# Patient Record
Sex: Female | Born: 1990 | Race: Black or African American | Hispanic: No | Marital: Single | Smoking: Former smoker
Health system: Southern US, Community
[De-identification: ages and names within clinical notes are randomized; demographics above are authoritative.]

## PROBLEM LIST (undated history)

## (undated) DIAGNOSIS — M791 Myalgia, unspecified site: Secondary | ICD-10-CM

## (undated) HISTORY — PX: MOUTH SURGERY: SHX715

---
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
## 2012-06-27 ENCOUNTER — Encounter: Payer: Self-pay | Admitting: Family Medicine

## 2012-06-27 ENCOUNTER — Ambulatory Visit (INDEPENDENT_AMBULATORY_CARE_PROVIDER_SITE_OTHER): Payer: 59 | Admitting: Family Medicine

## 2012-06-27 ENCOUNTER — Ambulatory Visit: Payer: 59 | Admitting: Family Medicine

## 2012-06-27 ENCOUNTER — Other Ambulatory Visit (HOSPITAL_COMMUNITY)
Admission: RE | Admit: 2012-06-27 | Discharge: 2012-06-27 | Disposition: A | Discharge status: Home / Self Care | Payer: 59 | Source: Ambulatory Visit | Attending: Family Medicine | Admitting: Family Medicine

## 2012-06-27 VITALS — BP 128/82 | HR 77 | Temp 98.6°F | Ht 68.5 in | Wt 326.0 lb

## 2012-06-27 DIAGNOSIS — Z202 Contact with and (suspected) exposure to infections with a predominantly sexual mode of transmission: Secondary | ICD-10-CM | POA: Insufficient documentation

## 2012-06-27 DIAGNOSIS — Z2089 Contact with and (suspected) exposure to other communicable diseases: Secondary | ICD-10-CM

## 2012-06-27 DIAGNOSIS — L83 Acanthosis nigricans: Secondary | ICD-10-CM

## 2012-06-27 DIAGNOSIS — Z113 Encounter for screening for infections with a predominantly sexual mode of transmission: Secondary | ICD-10-CM | POA: Insufficient documentation

## 2012-06-27 DIAGNOSIS — N898 Other specified noninflammatory disorders of vagina: Secondary | ICD-10-CM

## 2012-06-27 DIAGNOSIS — N939 Abnormal uterine and vaginal bleeding, unspecified: Secondary | ICD-10-CM

## 2012-06-27 DIAGNOSIS — N920 Excessive and frequent menstruation with regular cycle: Secondary | ICD-10-CM

## 2012-06-27 LAB — CBC
HCT: 38.7 % (ref 36.0–46.0)
Hemoglobin: 13.1 g/dL (ref 12.0–15.0)
MCH: 27.6 pg (ref 26.0–34.0)
MCHC: 33.9 g/dL (ref 30.0–36.0)

## 2012-06-27 MED ORDER — MEDROXYPROGESTERONE ACETATE 10 MG PO TABS: 10.0000 mg | ORAL_TABLET | Freq: Every day | ORAL | Status: DC

## 2012-06-27 NOTE — Patient Instructions (Signed)
Dear Vanessa Leonard,   It was great to meet you today. Thank you for coming to clinic. Please read below regarding the issues that we discussed.   1. We are going to check some labs for STIs and to check your hgb level. I will call you if abnormal, otherwise you can discuss at your first adult yearly physical. You can also discuss Gardasil at this visit.  2. Please take the provera for 10 days, your bleeding should likely stop within 3 days. If you were to develop chest pain, shortness of breath, lightheadedness, i would want you to be seen immediately.   Please follow up in clinic in within next month for yearly physical . Please call earlier if you have any questions or concerns.   Sincerely,  Dr. Tana Conch  Contraception Choices Birth control (contraception) can stop pregnancy from happening. Different types of birth control work in different ways. Some can:  Make the mucus in the cervix thick. This makes it hard for sperm to get into the uterus.   Thin the lining of the uterus. This makes it hard for an egg to attach to the wall of the uterus.   Stop the ovaries from releasing an egg.   Block the sperm from reaching the egg.  Certain types of surgery can stop pregnancy from happening. For women, the sugery closes the fallopian tubes (tubal ligation). For men, the surgery stops sperm from releasing during sex (vasectomy). HORMONAL BIRTH CONTROL Hormonal birth control stops pregnancy by putting hormones into your body. Types of birth control include:  A small tube put under the skin of the upper arm (implant). The tube can stay in place for 3 years.   Shots given every 3 months.   Pills taken every day or once after sex (intercourse).   Patches that are changed once a week.   A ring put into the vagina (vaginal ring). The ring is left in place for 3 weeks and removed for 1 week. Then, a new ring is put in the vagina.  BARRIER BIRTH CONTROL  Barrier birth control blocks  sperm from reaching the egg. Types of birth control include:   A thin covering worn on the penis (female condom) during sex.   A soft, loose covering put into the vagina (female condom) before sex.   A rubber bowl that sits over the cervix (diaphragm). The bowl must be made for you. The bowl is put into the vagina before sex. The bowl is left in place for 6 to 8 hours after sex.   A small, soft cup that fits over the cervix (cervical cap). The cup must be made for you. The cup can be left in place for 48 hours after sex.   A sponge that is put into the vagina before sex.   A chemical that kills or blocks sperm from getting into the cervix and uterus (spermicide). The chemical may be a cream, jelly, foam, or pill.  INTRAUTERINE (IUD) BIRTH CONTROL  IUD birth control is a small, T-shaped piece of plastic. The plastic is put inside the uterus. There are 2 types of IUD:  Copper IUD. The IUD is covered in copper wire. The copper makes a fluid that kills sperm. It can stay in place for 10 years.   Hormone IUD. The hormone stops pregnancy from happening. It can stay in place for 5 years.  NATURAL FAMILY PLANNING BIRTH CONTROL  Natural family planning means not having sex or using barrier  birth control when the woman is fertile. A woman can:  Use a calendar to keep track of when she is fertile.   Use a thermometer to measure her body temperature.  Protect yourself against sexual diseases no matter what type of birth control you use. Talk to your doctor about which type of birth control is best for you. Document Released: 09/09/2009 Document Revised: 11/01/2011 Document Reviewed: 03/21/2011 [facility] Patient Information 2012 [place], [place].

## 2012-06-28 NOTE — Assessment & Plan Note (Addendum)
Hemodynamically stable without red flags from blood loss. Urine pregnancy negative. CBC with hgb >13 today. Bleeding slowing but persists. Will give 10 day course of Provera.   Patient is to follow up with PCP to discuss birth control options-given handout today.   Patient young for endometrial hyperplasia but could consider endometrial biopsy at future visit.

## 2012-06-28 NOTE — Progress Notes (Signed)
Subjective:   1. Heavy menstrual bleeding-periods regularly with periods on monthly basis lasting 4-5 days. Current heavy period lasting 9 days. She was using 2 maxipads at a time and had to change 5x per day. No whas slowed to 1 pad but still 5 per day. Did have a quarter size clot at beginning of period and has noted a few a dime size or less but those have stopped.   ROS-no chest pain, shortness of breath, lightheadedness, fatigue.   2. Sexual activity-sexually active with 3 female partners and has not told mom. Never has been tested for STDs. Desires STD testing Condoms for birth control. Mom not aware but patient asked me to share with her mother and they plan to discuss after visit.   3. Health Maintenance- interested in Gardasil when outside of current period.   ROS--See HPI  Past Medical History-ADD, morbid obesity .  Reviewed problem list.  Medications- reviewed and updated Chief complaint-noted  Objective:    BP 128/82  Pulse 77  Temp 98.6 F (37 C) (Oral)  Ht 5' 8.5" (1.74 m)  Wt 326 lb (147.873 kg)  BMI 48.85 kg/m2 Gen: NAD CV: RRR no mrg Lungs: CTAB ABd: soft, nontender, nondistended GU: no active bleeding from cervical os. Noted dried blood with some serosanguinous material in vagina but no signs of active bleeding. No abnormalities of uterus on bimanual exam.  MSK: moves all extremities, no edema   Urine pregnancy negative today  Lab Results  Component Value Date   WBC 8.6 06/27/2012   HGB 13.1 06/27/2012   HCT 38.7 06/27/2012   MCV 81.5 06/27/2012   PLT 383 06/27/2012     Assessment/Plan: See problem oriented charted

## 2012-06-28 NOTE — Assessment & Plan Note (Signed)
GC/Chlamydia/syphilis/HIV testing today per patient request.

## 2012-06-30 ENCOUNTER — Encounter: Payer: Self-pay | Admitting: Family Medicine

## 2012-06-30 ENCOUNTER — Ambulatory Visit: Payer: 59 | Admitting: Family Medicine

## 2012-07-03 ENCOUNTER — Encounter: Payer: Self-pay | Admitting: Family Medicine

## 2012-07-03 ENCOUNTER — Ambulatory Visit (INDEPENDENT_AMBULATORY_CARE_PROVIDER_SITE_OTHER): Payer: 59 | Admitting: Family Medicine

## 2012-07-03 VITALS — BP 99/70 | HR 101 | Temp 98.6°F | Ht 68.5 in | Wt 329.0 lb

## 2012-07-03 DIAGNOSIS — Z00129 Encounter for routine child health examination without abnormal findings: Secondary | ICD-10-CM | POA: Insufficient documentation

## 2012-07-03 DIAGNOSIS — Z309 Encounter for contraceptive management, unspecified: Secondary | ICD-10-CM

## 2012-07-03 DIAGNOSIS — Z0000 Encounter for general adult medical examination without abnormal findings: Secondary | ICD-10-CM

## 2012-07-03 DIAGNOSIS — Z23 Encounter for immunization: Secondary | ICD-10-CM

## 2012-07-03 MED ORDER — NORGESTIMATE-ETH ESTRADIOL 0.25-35 MG-MCG PO TABS: 1.0000 | ORAL_TABLET | Freq: Every day | ORAL | Status: DC

## 2012-07-03 NOTE — Progress Notes (Signed)
Patient ID: [place] A Cotugno, female   DOB: 1991-03-28, 21 y.o.   MRN: [id number] Subjective:     History was provided by [place] (self).  [place] SHATIKA GRINNELL is a 21 y.o. female who is here for this wellness visit.   Current Issues: Current concerns include:None  H (Home) Family Relationships: good Communication: good with parents Responsibilities: has responsibilities at home and has a job  E Radiographer, therapeutic): Grades: Graduated and starts [employer] in the fall for [employer] education  School: graduated Future Plans: college  A (Activities) Sports: no sports Exercise: Yes  Activities: job Friends: Yes   A (Auton/Safety) Auto: wears seat belt Bike: doesn't wear bike helmet and does not ride Safety: discussed safe sex practices. She desires BC today and wants to start the pill. Explained STI and condom use. Patient seems to understand.  She is also moving out on her own in a week. Covered home safety. D (Diet) Diet: poor diet habits Risky eating habits: tends to overeat Intake: high fat diet Body Image: positive body image  Drugs Tobacco: No Alcohol: No Drugs: No  Sex Activity: sexually active and risky behaviors  Suicide Risk Emotions: healthy Depression: denies feelings of depression Suicidal: denies suicidal ideation     Objective:     Filed Vitals:   07/03/12 1439  BP: 99/70  Pulse: 101  Temp: 98.6 F (37 C)  TempSrc: Oral  Height: 5' 8.5" (1.74 m)  Weight: 329 lb (149.233 kg)   Growth parameters are noted and are appropriate for age.  General:   alert, cooperative and moderately obese  Gait:   normal  Skin:   normal  Oral cavity:   normal findings: lips normal without lesions  Eyes:   sclerae white, pupils equal and reactive, red reflex normal bilaterally  Ears:   normal bilaterally  Neck:   normal  Lungs:  clear to auscultation bilaterally  Heart:   regular rate and rhythm, S1, S2 normal, no murmur, click, rub or gallop  Abdomen:  soft, non-tender; bowel  sounds normal; no masses,  no organomegaly  GU:  not examined; Patient had a exam within the last month  Extremities:   extremities normal, atraumatic, no cyanosis or edema and no edema, redness or tenderness in the calves or thighs  Neuro:  normal without focal findings, mental status, speech normal, alert and oriented x3, PERLA, cranial nerves 2-12 intact and reflexes normal and symmetric     Assessment:    Healthy 21 y.o. female child.    Plan:   1. Anticipatory guidance discussed. Nutrition, Physical activity, Behavior, Safety and Handout given  2. Follow-up visit in 12 months for next wellness visit, or sooner as needed.

## 2012-07-03 NOTE — Assessment & Plan Note (Addendum)
[facility] 21 y.o healthy female - Immunizations : Menactra, Varicella, HPV #3 - AVS  STI and safety - Desiring contraception:         - Urine PT ordered today, will call if positive only        - [facility] called in to pharamacy         - Discussed safety in the home, car, going out (drinking), and safe sex - F/U: 1 year or PRN if any problems

## 2012-07-03 NOTE — Patient Instructions (Signed)
Safer Sex Your caregiver wants you to have this information about the infections that can be transmitted from sexual contact and how to prevent them. The idea behind safer sex is that you can be sexually active, and at the same time reduce the risk of giving or getting a sexually transmitted disease (STD). Every person should be aware of how to prevent him or herself and his or her sex partner from getting an STD. CAUSES OF STDS STDs are transmitted by sharing body fluids, which contain viruses and bacteria. The following fluids all transmit infections during sexual intercourse and sex acts:  Semen.   Saliva.   Urine.   Blood.   Vaginal mucus.  Examples of STDs include:  Chlamydia.   Gonorrhea.   Genital herpes.   Hepatitis B.   Human immunodeficiency virus or acquired immunodeficiency syndrome (HIV or AIDS).   Syphilis.   Trichomonas.   Pubic lice.   Human papillomavirus (HPV), which may include:   Genital warts.   Cervical dysplasia.   Cervical cancer (can develop with certain types of HPV).  SYMPTOMS  Sexual diseases often cause few or no symptoms until they are advanced, so a person can be infected and spread the infection without knowing it. Some STDs respond to treatment very well. Others, like HIV and herpes, cannot be cured, but are treated to reduce their effects. Specific symptoms include:  Abnormal vaginal discharge.   Irritation or itching in and around the vagina, and in the pubic hair.   Pain during sexual intercourse.   Bleeding during sexual intercourse.   Pelvic or abdominal pain.   Fever.   Growths in and around the vagina.   An ulcer in or around the vagina.   Swollen glands in the groin area.  DIAGNOSIS   Blood tests.   Pap test.   Culture test of abnormal vaginal discharge.   A test that applies a solution and examines the cervix with a lighted magnifying scope (colposcopy).   A test that examines the pelvis with a lighted  tube, through a small incision (laparoscopy).  TREATMENT  The treatment will depend on the cause of the STD.  Antibiotic treatment by injection, oral, creams, or suppositories in the vagina.   Over-the-counter medicated shampoo, to get rid of pubic lice.   Removing or treating growths with medicine, freezing, burning (electrocautery), or surgery.   Surgery treatment for HPV of the cervix.   Supportive medicines for herpes, HIV, AIDS, and hepatitis.  Being careful cannot eliminate all risk of infection, but sex can be made much safer. Safe sexual practices include body massage and gentle touching. Masturbation is safe, as long as body fluids do not contact skin that has sores or cuts. Dry kissing and oral sex on a man wearing a latex condom or on a woman wearing a female condom is also safe. Slightly less safe is intercourse while the man wears a latex condom or wet kissing. It is also safer to have one sex partner that you know is not having sex with anyone else. LENGTH OF ILLNESS An STD might be treated and cured in a week, sometimes a month, or more. And it can linger with symptoms for many years. STDs can also cause damage to the female organs. This can cause chronic pain, infertility, and recurrence of the STD, especially herpes, hepatitis, HIV, and HPV. HOME CARE INSTRUCTIONS AND PREVENTION  Alcohol and recreational drugs are often the reason given for not practicing safer sex. These substances affect   your judgment. Alcohol and recreational drugs can also impair your immune system, making you more vulnerable to disease.   Do not engage in risky and dangerous sexual practices, including:   Vaginal or anal sex without a condom.   Oral sex on a man without a condom.   Oral sex on a woman without a female condom.   Using saliva to lubricate a condom.   Any other sexual contact in which body fluids or blood from one partner contact the other partner.   You should use only latex  condoms for men and water soluble lubricants. Petroleum based lubricants or oils used to lubricate a condom will weaken the condom and increase the chance that it will break.   Think very carefully before having sex with anyone who is high risk for STDs and HIV. This includes IV drug users, people with multiple sexual partners, or people who have had an STD, or a positive hepatitis or HIV blood test.   Remember that even if your partner has had only one previous partner, their previous partner might have had multiple partners. If so, you are at high risk of being exposed to an STD. You and your sex partner should be the only sex partners with each other, with no one else involved.   A vaccine is available for hepatitis B and HPV through your caregiver or the Public Health Department. Everyone should be vaccinated with these vaccines.   Avoid risky sex practices. Sex acts that can break the skin make you more likely to get an STD.  SEEK MEDICAL CARE IF:   If you think you have an STD, even if you do not have any symptoms. Contact your caregiver for evaluation and treatment, if needed.   You think or know your sex partner has acquired an STD.   You have any of the symptoms mentioned above.  Document Released: 12/20/2004 Document Revised: 11/01/2011 Document Reviewed: 10/12/2009 [employer] Patient Information 2012 [employer]. 

## 2013-04-08 ENCOUNTER — Encounter (HOSPITAL_COMMUNITY): Payer: Self-pay | Admitting: Emergency Medicine

## 2013-04-08 ENCOUNTER — Emergency Department (HOSPITAL_COMMUNITY)
Admission: EM | Admit: 2013-04-08 | Discharge: 2013-04-08 | Disposition: A | Discharge status: Home / Self Care | Payer: 59 | Source: Home / Self Care | Attending: Family Medicine | Admitting: Family Medicine

## 2013-04-08 DIAGNOSIS — S91209A Unspecified open wound of unspecified toe(s) with damage to nail, initial encounter: Secondary | ICD-10-CM

## 2013-04-08 DIAGNOSIS — S91109A Unspecified open wound of unspecified toe(s) without damage to nail, initial encounter: Secondary | ICD-10-CM

## 2013-04-08 NOTE — ED Notes (Signed)
Patient reports pcp is family practice.  Reports tetanus was 2 years a

## 2013-04-08 NOTE — ED Provider Notes (Signed)
History     CSN: [id number]  Arrival date & time 04/08/13  1413   First MD Initiated Contact with Patient 04/08/13 1603      Chief Complaint  Patient presents with  . Fall    (Consider location/radiation/quality/duration/timing/severity/associated sxs/prior treatment) Patient is a 22 y.o. female presenting with foot injury. The history is provided by the patient.  Foot Injury Location:  Toe Time since incident:  1 day Injury: yes   Toe location:  L big toe Pain details:    Quality:  Burning   Radiates to:  Does not radiate   Onset quality:  Sudden (slipped and jammed left gt toenail in [employer] store)   Progression:  Unchanged Chronicity:  New Dislocation: no   Tetanus status:  Up to date Associated symptoms: neck pain   Associated symptoms: no back pain   Associated symptoms comment:  Abrasion to left lower leg and neck soreness today. Risk factors: obesity     History reviewed. No pertinent past medical history.  History reviewed. No pertinent past surgical history.  No family history on file.  History  Substance Use Topics  . Smoking status: Never Smoker   . Smokeless tobacco: Not on file  . Alcohol Use: No    OB History   Grav Para Term Preterm Abortions TAB SAB Ect Mult Living                  Review of Systems  Constitutional: Negative.   HENT: Positive for neck pain.   Cardiovascular: Negative for chest pain.  Gastrointestinal: Negative.   Musculoskeletal: Negative for back pain, joint swelling and gait problem.  Skin: Positive for wound.    Allergies  Review of patient's allergies indicates no known allergies.  Home Medications   Current Outpatient Rx  Name  Route  Sig  Dispense  Refill  . norgestimate-ethinyl estradiol (ORTHO-CYCLEN,SPRINTEC,PREVIFEM) 0.25-35 MG-MCG tablet   Oral   Take 1 tablet by mouth daily.   1 Package   11     BP 132/70  Pulse 74  Temp(Src) 98.6 F (37 C) (Oral)  Resp 16  SpO2 100%  LMP  03/09/2013  Physical Exam  Nursing note and vitals reviewed. Constitutional: She is oriented to person, place, and time. She appears well-developed and well-nourished.  HENT:  Head: Normocephalic.  Neck: Normal range of motion. Neck supple.  Musculoskeletal:       Feet:  Neurological: She is alert and oriented to person, place, and time.  Skin: Skin is warm and dry.  Minor lower leg linear abrasion    ED Course  Procedures (including critical care time)  Labs Reviewed - No data to display No results found.   1. Avulsion of toenail, initial encounter       MDM          Linna Hoff, MD 04/10/13 1006

## 2013-04-08 NOTE — ED Notes (Signed)
Reports a fall yesterday, left great toe nail is loosely attached and minimal bleeding around nail, bleeding controlled.  Abrasion to left lower leg. This nurse with physician when assessed

## 2013-04-08 NOTE — ED Notes (Signed)
Vitals obtained by [employer] student sam

## 2013-04-15 ENCOUNTER — Encounter: Payer: Self-pay | Admitting: Family Medicine

## 2013-04-15 ENCOUNTER — Ambulatory Visit (INDEPENDENT_AMBULATORY_CARE_PROVIDER_SITE_OTHER): Payer: 59 | Admitting: Family Medicine

## 2013-04-15 VITALS — BP 149/85 | HR 102 | Ht 68.0 in | Wt 329.0 lb

## 2013-04-15 DIAGNOSIS — M549 Dorsalgia, unspecified: Secondary | ICD-10-CM

## 2013-04-15 DIAGNOSIS — M62830 Muscle spasm of back: Secondary | ICD-10-CM | POA: Insufficient documentation

## 2013-04-15 DIAGNOSIS — R030 Elevated blood-pressure reading, without diagnosis of hypertension: Secondary | ICD-10-CM

## 2013-04-15 MED ORDER — CYCLOBENZAPRINE HCL 10 MG PO TABS: 5.0000 mg | ORAL_TABLET | Freq: Two times a day (BID) | ORAL | Status: DC | PRN

## 2013-04-15 NOTE — Patient Instructions (Addendum)
Seems like you have some muscle spasm after your fall. No sign of any serious damage today. Normal to have a recovery period of several weeks. You can try flexeril at night for spasm. Keep using motrin/ibuprofen at 800 mg up to 2-3 times daily.  Try back stretches and exercises.  Make an appointmetn with Dr. Claiborne Billings in 3 weeks or sooner if worsens.   Back Exercises Back exercises help treat and prevent back injuries. The goal is to increase your strength in your belly (abdominal) and back muscles. These exercises can also help with flexibility. Start these exercises when told by your doctor. HOME CARE Back exercises include: Pelvic Tilt.  Lie on your back with your knees bent. Tilt your pelvis until the lower part of your back is against the floor. Hold this position 5 to 10 sec. Repeat this exercise 5 to 10 times. Knee to Chest.  Pull 1 knee up against your chest and hold for 20 to 30 seconds. Repeat this with the other knee. This may be done with the other leg straight or bent, whichever feels better. Then, pull both knees up against your chest. Sit-Ups or Curl-Ups.  Bend your knees 90 degrees. Start with tilting your pelvis, and do a partial, slow sit-up. Only lift your upper half 30 to 45 degrees off the floor. Take at least 2 to 3 seonds for each sit-up. Do not do sit-ups with your knees out straight. If partial sit-ups are difficult, simply do the above but with only tightening your belly (abdominal) muscles and holding it as told. Hip-Lift.  Lie on your back with your knees flexed 90 degrees. Push down with your feet and shoulders as you raise your hips 2 inches off the floor. Hold for 10 seconds, repeat 5 to 10 times. Back Arches.  Lie on your stomach. Prop yourself up on bent elbows. Slowly press on your hands, causing an arch in your low back. Repeat 3 to 5 times. Shoulder-Lifts.  Lie face down with arms beside your body. Keep hips and belly pressed to floor as you slowly lift  your head and shoulders off the floor. Do not overdo your exercises. Be careful in the beginning. Exercises may cause you some mild back discomfort. If the pain lasts for more than 15 minutes, stop the exercises until you see your doctor. Improvement with exercise for back problems is slow.  Document Released: 12/15/2010 Document Revised: 02/04/2012 Document Reviewed: 09/13/2011 [facility] Patient Information 2014 [place], [place].

## 2013-04-15 NOTE — Progress Notes (Signed)
  Subjective:    Patient ID: Vanessa Leonard, female    DOB: July 25, 1991, 22 y.o.   MRN: [id number]  HPI  22 yo female presents with neck and back pain after a fall on 5/14 at a store on a slick floor. Patient with her mother today, state they just want to make sure nothing serious and needs something for pain.   Fell straight onto her back one week ago. Neck pain started the next day on the left side, worse with turning head to right and low back feels stiff also. Neck is much better since the injury, the low back is slightly improved since injury. Still using ibuprofen twice a day currently.   Review of Systems Denies any numbness, tingling, weakness, joint pain, headaches, recurrent falls, vision changes, pain in hands/feet, urinary changes.     Objective:   Physical Exam  Vitals reviewed. Constitutional: She appears well-developed and well-nourished. No distress.  Obese AA female.  Eyes: EOM are normal. Pupils are equal, round, and reactive to light.  Neck: Normal range of motion. Neck supple.  ROM intact. Mild pain with bilateral lat flexion. Rotation intact. TTP on left trapezius. 5/5 strength with grip, pincer, shoulder abduction bilaterally.   Musculoskeletal:  TTP on left paraspinal musculature surrounding T10-L5. No bony TTP. Lumbar flexion slightly decreased. Normal gait. 5/5 dorsiflexion bilaterally, knee flexion/extension. Negative straight leg test.  Normal gait.  Sensation intact in all extremities.   Skin: No rash noted. She is not diaphoretic.  Psychiatric: She has a normal mood and affect.          Assessment & Plan:

## 2013-04-15 NOTE — Assessment & Plan Note (Signed)
Noted today in acute pain. Could be related to NSAID or OCP use. Advised to f/u with PCP in 3 weeks.

## 2013-04-15 NOTE — Assessment & Plan Note (Signed)
Some back and neck MSK pain and spasm. Has improved since her fall one week ago. Her overall ROM is intact without neurologic red flags or radiculopathy today. C/w diffuse left trapezius and paraspinal musculature spasm. Recommend to continue NSAID. Start flexeril qhs prn. Discussed stretches, heating pain/ice pack, strengthening exercises. F/u with PCP in 3 weeks or sooner if worsens. Could consider PT or imaging if not improving.

## 2013-07-15 ENCOUNTER — Other Ambulatory Visit (HOSPITAL_COMMUNITY)
Admission: RE | Admit: 2013-07-15 | Discharge: 2013-07-15 | Disposition: A | Discharge status: Home / Self Care | Payer: 59 | Source: Ambulatory Visit | Attending: Family Medicine | Admitting: Family Medicine

## 2013-07-15 ENCOUNTER — Encounter: Payer: Self-pay | Admitting: Family Medicine

## 2013-07-15 ENCOUNTER — Ambulatory Visit (INDEPENDENT_AMBULATORY_CARE_PROVIDER_SITE_OTHER): Payer: 59 | Admitting: Family Medicine

## 2013-07-15 VITALS — BP 144/76 | HR 83 | Temp 99.0°F | Ht 68.0 in | Wt 327.1 lb

## 2013-07-15 DIAGNOSIS — A499 Bacterial infection, unspecified: Secondary | ICD-10-CM

## 2013-07-15 DIAGNOSIS — B9689 Other specified bacterial agents as the cause of diseases classified elsewhere: Secondary | ICD-10-CM

## 2013-07-15 DIAGNOSIS — N760 Acute vaginitis: Secondary | ICD-10-CM

## 2013-07-15 DIAGNOSIS — Z202 Contact with and (suspected) exposure to infections with a predominantly sexual mode of transmission: Secondary | ICD-10-CM

## 2013-07-15 DIAGNOSIS — Z2089 Contact with and (suspected) exposure to other communicable diseases: Secondary | ICD-10-CM

## 2013-07-15 DIAGNOSIS — Z113 Encounter for screening for infections with a predominantly sexual mode of transmission: Secondary | ICD-10-CM | POA: Insufficient documentation

## 2013-07-15 DIAGNOSIS — Z309 Encounter for contraceptive management, unspecified: Secondary | ICD-10-CM

## 2013-07-15 DIAGNOSIS — Z3009 Encounter for other general counseling and advice on contraception: Secondary | ICD-10-CM | POA: Insufficient documentation

## 2013-07-15 LAB — POCT WET PREP (WET MOUNT)
Clue Cells Wet Prep Whiff POC: POSITIVE
WBC, Wet Prep HPF POC: >20

## 2013-07-15 MED ORDER — METRONIDAZOLE 500 MG PO TABS: 500.0000 mg | ORAL_TABLET | Freq: Two times a day (BID) | ORAL | Status: DC

## 2013-07-15 MED ORDER — MEDROXYPROGESTERONE ACETATE 150 MG/ML IM SUSP
150.0000 mg | Freq: Once | INTRAMUSCULAR | Status: AC
  Administered 2013-07-15: 150 mg via INTRAMUSCULAR

## 2013-07-15 NOTE — Patient Instructions (Addendum)
Safe Sex Safe sex is about reducing the risk of giving or getting a sexually transmitted disease (STD). STDs are spread through sexual contact involving the genitals, mouth, or rectum. Some STDS can be cured and others cannot. Safe sex can also prevent unintended pregnancies.  SAFE SEX PRACTICES  Limit your sexual activity to only one partner who is only having sex with you.  Talk to your partner about their past partners, past STDs, and drug use.  Use a condom every time you have sexual intercourse. This includes vaginal, oral, and anal sexual activity. Both females and males should wear condoms during oral sex. Only use latex or polyurethane condoms and water-based lubricants. Petroleum-based lubricants or oils used to lubricate a condom will weaken the condom and increase the chance that it will break. The condom should be in place from the beginning to the end of sexual activity. Wearing a condom reduces, but does not completely eliminate, your risk of getting or giving a STD. STDs can be spread by contact with skin of surrounding areas.  Get vaccinated for hepatitis B and HPV.  Avoid alcohol and recreational drugs which can affect your judgement. You may forget to use a condom or participate in high-risk sex.  For females, avoid douching after sexual intercourse. Douching can spread an infection farther into the reproductive tract.  Check your body for signs of sores, blisters, rashes, or unusual discharge. See your caregiver if you notice any of these signs.  Avoid sexual contact if you have symptoms of an infection or are being treated for an STD. If you or your partner has herpes, avoid sexual contact when blisters are present. Use condoms at all other times.  See your caregiver for regular screenings, examinations, and tests for STDs. Before having sex with a new partner, each of you should be screened for STDs and talk about the results with your partner. BENEFITS OF SAFE SEX   There  is less of a chance of getting or giving an STD.  You can prevent unwanted or unintended pregnancies.  By discussing safer sex concerns with your partner, you may increase feelings of intimacy, comfort, trust, and honesty between the both of you. Document Released: 12/20/2004 Document Revised: 08/06/2012 Document Reviewed: 05/05/2012 [facility] Patient Information 2014 [place], [place].   Your next Depo shot will be in 3 months. We will evaluate at that time if you are happy with it and want to remain on it or try another pill.  I will call you with all your test results today.

## 2013-07-15 NOTE — Assessment & Plan Note (Signed)
--   wet prep today with clue cells and positive whiff.  - Flagyl 7 days prescribed.

## 2013-07-15 NOTE — Progress Notes (Signed)
Patient is due for next depo 11/5 - 11/19

## 2013-07-15 NOTE — Assessment & Plan Note (Signed)
-   Patient again feels she may have been exposed to STD. STD panel repeated today.  - AVS on safe sex practices given and discussed

## 2013-07-15 NOTE — Progress Notes (Signed)
Subjective:     Patient ID: Vanessa Leonard, female   DOB: June 10, 1991, 22 y.o.   MRN: [id number]  HPI  Contraception management: Patient has not taken contraception (BCP) since June because she does not like "the way it made her feel." She states she felt sick on it. Her last LMP was June 10th. She has had unprotected sex in the meantime (weeks ago). She would like to try the depo shot today in place of BCP. She is sexually active with one partner.   Vaginal discharge: She reports her vaginal area was swollen for a couple days. She reports white discharge with mild irritation. She does not endorse lesions prior. She has concerns for STD and would like to be tested.    Patient's past medical, social, and family history were reviewed and updated as appropriate.  Review of Systems Negative, with the exception of above mentioned in HPI  Objective:   Physical Exam BP 144/76  Pulse 83  Temp(Src) 99 F (37.2 C) (Oral)  Ht 5\' 8"  (1.727 m)  Wt 327 lb 1.6 oz (148.372 kg)  BMI 49.75 kg/m2 Gen: morbidly obese female. Pleasant. Immature.  CV: RRR Chest: CTAB Abd: Obese. Soft. NTND. BS present GYN:  External genitalia within normal limits.  Vaginal mucosa pink, moist, normal rugae.  Nonfriable cervix without lesions, mild white thin discharge. No bleeding noted on speculum exam.  Bimanual exam revealed normal, nongravid uterus.  No cervical motion tenderness. No adnexal masses bilaterally.    Next Visit: - check BP

## 2013-07-15 NOTE — Assessment & Plan Note (Signed)
-   Patient does nto desire the BCP any longer. She feels it makes her sick.  - Upreg negative today - Depo shot given - return in 3 months to discuss if she would like to continue with depo or try another pill

## 2013-07-17 ENCOUNTER — Telehealth: Payer: Self-pay | Admitting: Family Medicine

## 2013-07-17 NOTE — Telephone Encounter (Signed)
PLease call Vanessa Leonard and inform her the rest of her labs are back and are normal. (No STD). Thanks

## 2013-07-17 NOTE — Telephone Encounter (Signed)
LM with patient's mother for her to call us back 

## 2013-07-17 NOTE — Telephone Encounter (Signed)
Patient called back and was given the message below

## 2013-07-23 ENCOUNTER — Encounter (HOSPITAL_COMMUNITY): Payer: Self-pay

## 2013-07-23 ENCOUNTER — Emergency Department (HOSPITAL_COMMUNITY)
Admission: EM | Admit: 2013-07-23 | Discharge: 2013-07-23 | Discharge status: Against Medical Advice | Payer: 59 | Attending: Emergency Medicine | Admitting: Emergency Medicine

## 2013-07-23 DIAGNOSIS — M549 Dorsalgia, unspecified: Secondary | ICD-10-CM | POA: Insufficient documentation

## 2013-07-23 DIAGNOSIS — R109 Unspecified abdominal pain: Secondary | ICD-10-CM | POA: Insufficient documentation

## 2013-07-23 HISTORY — DX: Myalgia, unspecified site: M79.10

## 2013-07-23 NOTE — ED Notes (Signed)
Pt states approach 1 hour ago she began having lower back pain and is on flexeril and hydrocodone for pain. Told ems it is more her back pain,

## 2013-07-23 NOTE — ED Notes (Signed)
Pt called in lobby, no answer.

## 2013-09-29 ENCOUNTER — Encounter: Payer: Self-pay | Admitting: Emergency Medicine

## 2013-09-29 ENCOUNTER — Ambulatory Visit (INDEPENDENT_AMBULATORY_CARE_PROVIDER_SITE_OTHER): Payer: 59 | Admitting: Emergency Medicine

## 2013-09-29 VITALS — BP 117/78 | HR 52 | Temp 97.7°F | Ht 68.0 in | Wt 315.0 lb

## 2013-09-29 DIAGNOSIS — M5380 Other specified dorsopathies, site unspecified: Secondary | ICD-10-CM

## 2013-09-29 DIAGNOSIS — R109 Unspecified abdominal pain: Secondary | ICD-10-CM | POA: Insufficient documentation

## 2013-09-29 DIAGNOSIS — M62830 Muscle spasm of back: Secondary | ICD-10-CM

## 2013-09-29 MED ORDER — CYCLOBENZAPRINE HCL 10 MG PO TABS: 5.0000 mg | ORAL_TABLET | Freq: Two times a day (BID) | ORAL | Status: DC | PRN

## 2013-09-29 NOTE — Patient Instructions (Signed)
It was nice to meet you!  I think your stomach pains and some of your back pains are related to starting Depo shots.  Give it another 3 months to see how your body will adjust to the depo shots. You can take ibuprofen as needed for the pain. I also sent in some flexeril that you can use for your back as needed.  If the pain worsens, becomes more constant, or changes, please let us know.

## 2013-09-29 NOTE — Assessment & Plan Note (Addendum)
Given timing, likely related to Depo shot and irregular bleeding. No red flags on history or exam. Discussed symptomatic care with ibuprofen. Will give Depo another 3 months before deciding on stopping. Return precautions reviewed as in AVS.

## 2013-09-29 NOTE — Progress Notes (Signed)
  Subjective:    Patient ID: Vanessa Leonard, female    DOB: 08/16/1991, 22 y.o.   MRN: [id number]  HPI Vanessa Leonard is here for a SDA for stomach pains.  She reports that since August she has been having intermittent stomach pains and back pains.  She states the stomach pain is always across the middle, but the back pain will move around.  It occurs 1-2 times a month and lasts for a few hours.  She has tried aleve and it helps with the pain.  She started Depo in August and has had some irregular bleeding since then.  She is not sure if the pains are associated with the bleeding.  No nausea, vomiting, diarrhea, dysuria, fevers.  Denies any abdominal pain today.  Does have some right upper back pain today.  I have reviewed and updated the following as appropriate: allergies and current medications SHx: former smoker  Review of Systems See HPI    Objective:   Physical Exam BP 117/78  Pulse 52  Temp(Src) 97.7 F (36.5 C) (Oral)  Ht 5\' 8"  (1.727 m)  Wt 315 lb (142.883 kg)  BMI 47.91 kg/m2  LMP 09/25/2013 Gen: alert, cooperative, NAD HEENT: AT/[place], sclera white, MMM Neck: supple Back: tender over right trapezius with mild spasm Abd: +BS, soft, NTND     Assessment & Plan:

## 2013-09-29 NOTE — Assessment & Plan Note (Signed)
Having intermittent continues spasms. Will refill flexeril.

## 2013-10-09 ENCOUNTER — Encounter: Payer: Self-pay | Admitting: Family Medicine

## 2013-10-09 ENCOUNTER — Ambulatory Visit (INDEPENDENT_AMBULATORY_CARE_PROVIDER_SITE_OTHER): Payer: 59 | Admitting: Family Medicine

## 2013-10-09 VITALS — BP 140/80 | HR 106 | Temp 98.2°F | Ht 68.0 in | Wt 314.4 lb

## 2013-10-09 DIAGNOSIS — L0231 Cutaneous abscess of buttock: Secondary | ICD-10-CM

## 2013-10-09 MED ORDER — AMOXICILLIN-POT CLAVULANATE 875-125 MG PO TABS: 1.0000 | ORAL_TABLET | Freq: Two times a day (BID) | ORAL | Status: DC

## 2013-10-09 MED ORDER — HYDROCODONE-ACETAMINOPHEN 5-325 MG PO TABS: 1.0000 | ORAL_TABLET | Freq: Four times a day (QID) | ORAL | Status: DC | PRN

## 2013-10-09 NOTE — Assessment & Plan Note (Signed)
Suspect skin origin.  Draining spontaneously.  Given foul smell, will treat for anaerobes rather than MRSA.

## 2013-10-09 NOTE — Patient Instructions (Signed)
Take the antibiotic for 10 days until gone even if you are feeling better. I will give you a prescription for a few pain pills It is best if the infection keeps draining.  Take a hot bath twice a day and massage the area to keep it open. I do not need to see you again if it clears up.  If a pus pocket forms again, I will need to see you to drain it.

## 2013-10-09 NOTE — Assessment & Plan Note (Signed)
Check for DM negative - nl A1C. Still advise wt loss

## 2013-10-09 NOTE — Progress Notes (Signed)
  Subjective:    Patient ID: Vanessa Leonard, female    DOB: 08-06-1991, 22 y.o.   MRN: [id number]  HPI Patient has painful infection on upper inner aspect of thigh.  Drained spontaneously this morning with foul smelling pus.  No systemic symptoms.  No history of frequent abscesses.  Has had acanthosis nigricans and is morbidly obese, so DM is possible as risk factor    Review of Systems     Objective:   Physical ExamObesity hampers exam.  Abscess which I think originated from skin although it could be perirectal since proximate to anus.  A couple of cm away from vagina so doubt vaginal origin.  Draining anaerobic smelling discharge.  Quite a bit of surrounding tenderness.  Not much erythema or cellulits.  No fluctuant area to drain.        Assessment & Plan:

## 2013-10-16 ENCOUNTER — Encounter (HOSPITAL_COMMUNITY): Payer: Self-pay | Admitting: Emergency Medicine

## 2013-10-16 ENCOUNTER — Emergency Department (HOSPITAL_COMMUNITY)
Admission: EM | Admit: 2013-10-16 | Discharge: 2013-10-16 | Disposition: A | Discharge status: Home / Self Care | Payer: 59 | Attending: Emergency Medicine | Admitting: Emergency Medicine

## 2013-10-16 DIAGNOSIS — J069 Acute upper respiratory infection, unspecified: Secondary | ICD-10-CM | POA: Insufficient documentation

## 2013-10-16 DIAGNOSIS — Z3202 Encounter for pregnancy test, result negative: Secondary | ICD-10-CM | POA: Insufficient documentation

## 2013-10-16 DIAGNOSIS — Z8709 Personal history of other diseases of the respiratory system: Secondary | ICD-10-CM | POA: Insufficient documentation

## 2013-10-16 DIAGNOSIS — Z87891 Personal history of nicotine dependence: Secondary | ICD-10-CM | POA: Insufficient documentation

## 2013-10-16 DIAGNOSIS — R1110 Vomiting, unspecified: Secondary | ICD-10-CM | POA: Insufficient documentation

## 2013-10-16 DIAGNOSIS — R0789 Other chest pain: Secondary | ICD-10-CM | POA: Insufficient documentation

## 2013-10-16 LAB — URINALYSIS, ROUTINE W REFLEX MICROSCOPIC
Glucose, UA: NEGATIVE mg/dL
Nitrite: NEGATIVE
Specific Gravity, Urine: 1.03 (ref 1.005–1.030)
pH: 5.5 (ref 5.0–8.0)

## 2013-10-16 LAB — URINE MICROSCOPIC-ADD ON

## 2013-10-16 LAB — PREGNANCY, URINE: Preg Test, Ur: NEGATIVE

## 2013-10-16 MED ORDER — HYDROCODONE-HOMATROPINE 5-1.5 MG/5ML PO SYRP
5.0000 mL | ORAL_SOLUTION | Freq: Once | ORAL | Status: AC
  Administered 2013-10-16: 5 mL via ORAL
  Filled 2013-10-16: qty 5

## 2013-10-16 MED ORDER — HYDROCODONE-HOMATROPINE 5-1.5 MG/5ML PO SYRP: 5.0000 mL | ORAL_SOLUTION | Freq: Four times a day (QID) | ORAL | Status: DC | PRN

## 2013-10-16 MED ORDER — CETIRIZINE-PSEUDOEPHEDRINE ER 5-120 MG PO TB12: 1.0000 | ORAL_TABLET | Freq: Two times a day (BID) | ORAL | Status: DC

## 2013-10-16 NOTE — ED Provider Notes (Signed)
CSN: [id number]     Arrival date & time 10/16/13  0208 History   First MD Initiated Contact with Patient 10/16/13 0344     Chief Complaint  Patient presents with  . Cough   (Consider location/radiation/quality/duration/timing/severity/associated sxs/prior Treatment) HPI Comments: Patient is a 22 year old otherwise healthy female who presents for a cough with onset this morning. Patient states that cough is productive of clear sputum and without any modifying factors. Patient endorses associated posttussive emesis as well as nasal congestion and postnasal drip. Patient has not taken anything for her symptoms today and she denies sick contacts. Patient states she has a history of seasonal allergies, but she denies history of asthma. She further denies associated fever, sore throat, inability to swallow or drooling, neck pain or stiffness, chest pain, shortness of breath, syncope, or numbness or tingling.  Patient is a 22 y.o. female presenting with cough. The history is provided by the patient. No language interpreter was used.  Cough Associated symptoms: no chest pain and no fever     Past Medical History  Diagnosis Date  . Muscle ache    Past Surgical History  Procedure Laterality Date  . Mouth surgery     History reviewed. No pertinent family history. History  Substance Use Topics  . Smoking status: Former Games developer  . Smokeless tobacco: Never Used  . Alcohol Use: No   OB History   Grav Para Term Preterm Abortions TAB SAB Ect Mult Living                 Review of Systems  Constitutional: Negative for fever.  Respiratory: Positive for cough and chest tightness.   Cardiovascular: Negative for chest pain.  Gastrointestinal: Positive for vomiting (posttussive). Negative for nausea.  All other systems reviewed and are negative.    Allergies  Review of patient's allergies indicates no known allergies.  Home Medications   Current Outpatient Rx  Name  Route  Sig  Dispense   Refill  . amoxicillin-clavulanate (AUGMENTIN) 875-125 MG per tablet   Oral   Take 1 tablet by mouth 2 (two) times daily.   20 tablet   0   . cyclobenzaprine (FLEXERIL) 10 MG tablet   Oral   Take 0.5-1 tablets (5-10 mg total) by mouth 2 (two) times daily as needed for muscle spasms.   30 tablet   0   . HYDROcodone-acetaminophen (NORCO/VICODIN) 5-325 MG per tablet   Oral   Take 1 tablet by mouth every 6 (six) hours as needed for moderate pain.   12 tablet   0   . medroxyPROGESTERone (DEPO-PROVERA) 150 MG/ML injection   Intramuscular   Inject 150 mg into the muscle every 3 (three) months.          BP 101/61  Pulse 115  Temp(Src) 98.6 F (37 C) (Oral)  Resp 20  Ht 5\' 8"  (1.727 m)  Wt 314 lb (142.429 kg)  BMI 47.75 kg/m2  SpO2 98%  LMP 09/25/2013  Physical Exam  Nursing note and vitals reviewed. Constitutional: She is oriented to person, place, and time. She appears well-developed and well-nourished. No distress.  HENT:  Head: Normocephalic and atraumatic.  Right Ear: Tympanic membrane, external ear and ear canal normal.  Left Ear: Tympanic membrane, external ear and ear canal normal.  Nose: Right sinus exhibits no maxillary sinus tenderness and no frontal sinus tenderness. Left sinus exhibits no maxillary sinus tenderness and no frontal sinus tenderness.  Mouth/Throat: Uvula is midline, oropharynx is clear and moist and  mucous membranes are normal. No oropharyngeal exudate.  Eyes: Conjunctivae and EOM are normal. Pupils are equal, round, and reactive to light. No scleral icterus.  Neck: Normal range of motion. Neck supple.  Cardiovascular: Normal rate, regular rhythm and normal heart sounds.   Pulmonary/Chest: Effort normal and breath sounds normal. No respiratory distress. She has no wheezes. She has no rales.  No retractions or accessory muscle use. Symmetric chest expansion. Cough appreciated which is dry and nonproductive.  Abdominal: Soft. There is no tenderness.  There is no rebound.  Musculoskeletal: Normal range of motion.  Neurological: She is alert and oriented to person, place, and time. GCS eye subscore is 4. GCS verbal subscore is 5. GCS motor subscore is 6.  Skin: Skin is warm and dry. No rash noted. She is not diaphoretic. No erythema. No pallor.  Psychiatric: She has a normal mood and affect. Her behavior is normal.    ED Course  Procedures (including critical care time) Labs Review Labs Reviewed  URINALYSIS, ROUTINE W REFLEX MICROSCOPIC - Abnormal; Notable for the following:    Color, Urine AMBER (*)    APPearance CLOUDY (*)    Hgb urine dipstick LARGE (*)    Bilirubin Urine SMALL (*)    Ketones, ur 15 (*)    Protein, ur 30 (*)    Leukocytes, UA SMALL (*)    All other components within normal limits  URINE MICROSCOPIC-ADD ON - Abnormal; Notable for the following:    Squamous Epithelial / LPF FEW (*)    Casts HYALINE CASTS (*)    All other components within normal limits  PREGNANCY, URINE   Imaging Review No results found.  EKG Interpretation   None       MDM   1. Viral URI with cough    22 year old female with no significant past medical history presents for cough with onset this morning. Patient with associated nasal congestion and postnasal drip. Patient well and nontoxic appearing, hemodynamically stable, and afebrile. Physical exam significant for a cough which is appreciated to be dry and nonproductive. Lungs clear to auscultation bilaterally and patient is without retractions or accessory muscle use; no acute respiratory distress. Doubt pneumonia given lack of fever, tachypnea, dyspnea, and hypoxia. Patient ambulates without hypoxia. Do not believe emergent chest x-ray indicated at this time. Patient given Hycodan for symptoms in ED. She is stable appropriate for discharge with prescriptions for Zyrtec-D and Hycodan for symptomatic management. Primary care provider follow up advised and patient agreeable to plan with no  unaddressed concerns.    Antony Madura, PA-C 10/16/13 [phone]

## 2013-10-16 NOTE — ED Notes (Signed)
Urine sent to the lab

## 2013-10-16 NOTE — ED Notes (Signed)
Patient presents with cough.  Coughs so hard at times she vomits a small amount.

## 2013-10-16 NOTE — ED Notes (Signed)
Patient has ride home with friend 

## 2013-10-16 NOTE — ED Notes (Signed)
Pulse ox 94-98% while ambulating

## 2013-10-17 NOTE — ED Provider Notes (Signed)
Medical screening examination/treatment/procedure(s) were performed by non-physician practitioner and as supervising physician I was immediately available for consultation/collaboration.  EKG Interpretation   None        Derwood Kaplan, MD 10/17/13 [phone]

## 2013-10-21 ENCOUNTER — Ambulatory Visit (INDEPENDENT_AMBULATORY_CARE_PROVIDER_SITE_OTHER): Payer: 59 | Admitting: *Deleted

## 2013-10-21 DIAGNOSIS — Z3009 Encounter for other general counseling and advice on contraception: Secondary | ICD-10-CM

## 2013-10-21 MED ORDER — MEDROXYPROGESTERONE ACETATE 150 MG/ML IM SUSP
150.0000 mg | Freq: Once | INTRAMUSCULAR | Status: AC
Start: 2013-10-21 — End: 2013-10-21
  Administered 2013-10-21: 150 mg via INTRAMUSCULAR

## 2013-10-21 NOTE — Progress Notes (Addendum)
   Patient here today for Depo Provera injection.  Was due to come back by 10/14/13.  Denies recent sexual activity.  Will obtain urine pregnancy test today per protocol.  Urine preg negative.  Depo given today and patient informed to use protection x 7 days.  Patient agreeable and verbalized understanding  Next injection due Feb. 11-25 .  Reminder card given.  Gaylene Brooks, RN

## 2013-11-05 ENCOUNTER — Encounter: Payer: Self-pay | Admitting: Family Medicine

## 2013-11-05 ENCOUNTER — Ambulatory Visit (INDEPENDENT_AMBULATORY_CARE_PROVIDER_SITE_OTHER): Payer: 59 | Admitting: Family Medicine

## 2013-11-05 VITALS — BP 122/82 | HR 98 | Temp 98.2°F | Ht 68.0 in | Wt 308.0 lb

## 2013-11-05 DIAGNOSIS — F411 Generalized anxiety disorder: Secondary | ICD-10-CM

## 2013-11-05 NOTE — Progress Notes (Signed)
Vanessa Leonard is a 22 y.o. female who presents to [facility] today for anxiety  Anxiety: Came in today b/c felt like "she was going to explode" b/c everyone is nagging her about needintg counseling for her nerves. Short fuse. Friend w/ pt who confirms that many/everything gets on her nerves. Cries often. No change around time of period. Sometimes can feel like hurting someone but denies any HI or thoughts of hurting someone. Denies SI. oingoing for 2.5 years ago. Onset of symptoms came around the time of graduation and moving out from home and assuming a lot of new responsibilities. 16 oz of pepsi some days. Smoking THC is only stress reliever. Applying for jobs. And stopped school due to stress.  Started smoking THC as she was entering 12th grade. Stopped taking ADD meds since 5-6th grade.   Tobacco use: around 3 cig per day. Started about 1 year ago.   THC: smokes about 8+ joints in a day. No synthetics. No other drugs.   The following portions of the patient's history were reviewed and updated as appropriate: allergies, current medications, past medical history, family and social history, and problem list.  Patient is a tobacco user   Past Medical History  Diagnosis Date  . Muscle ache    ROS as above otherwise neg.    Medications reviewed. Current Outpatient Prescriptions  Medication Sig Dispense Refill  . cetirizine-pseudoephedrine (ZYRTEC-D) 5-120 MG per tablet Take 1 tablet by mouth 2 (two) times daily.  30 tablet  0  . cyclobenzaprine (FLEXERIL) 10 MG tablet Take 0.5-1 tablets (5-10 mg total) by mouth 2 (two) times daily as needed for muscle spasms.  30 tablet  0  . HYDROcodone-acetaminophen (NORCO/VICODIN) 5-325 MG per tablet Take 1 tablet by mouth every 6 (six) hours as needed for moderate pain.  12 tablet  0  . HYDROcodone-homatropine (HYCODAN) 5-1.5 MG/5ML syrup Take 5 mLs by mouth every 6 (six) hours as needed for cough.  120 mL  0  . medroxyPROGESTERone (DEPO-PROVERA) 150 MG/ML  injection Inject 150 mg into the muscle every 3 (three) months.       No current facility-administered medications for this visit.    Exam:  BP 122/92  Pulse 98  Temp(Src) 98.2 F (36.8 C) (Oral)  Ht 5\' 8"  (1.727 m)  Wt 308 lb (139.708 kg)  BMI 46.84 kg/m2 Gen: Well NAD HEENT: EOMI,  MMM   No results found for this or any previous visit (from the past 72 hour(s)).  A/P (as seen in Problem list)  Anxiety state, unspecified Pt w/ likely predisposition for anxiety given h/o ADD (ADHD). 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 The reality is it is now contributing to her anxiety state as she is continually thinking about her next Marijuana use. Pt may benefit from antidepressant but will defer to PCP after she weans down/off THC. Pt to focus on and keep a journal daily of her anxiousness, what she does to combat this, focusing on what makes her happy and calm outside of THC. Pt to f/u in 1-2 wks w/ Dr. Claiborne Billings.  GAD 7 of 19/21    Spent > 25 min in direct pt care and counseling

## 2013-11-05 NOTE — Patient Instructions (Signed)
You can beat this anxiety and be a happy calm person again You are probably an ancxious person by nature and may need medicine for this but not until you stop your Marijuana use.  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 Try to cut back every day and focus on what you need to do to make you happy and calm. Keep a daily journal of what you are doing to cut back and be happy and calm. Please involve your friends and family in this effort if they will help Please come in on Dec 22 at 10:45 to see Dr Claiborne Billings

## 2013-11-05 NOTE — Assessment & Plan Note (Signed)
Pt w/ likely predisposition for anxiety given h/o ADD (ADHD). 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 The reality is it is now contributing to her anxiety state as she is continually thinking about her next Marijuana use. Pt may benefit from antidepressant but will defer to PCP after she weans down/off THC. Pt to focus on and keep a journal daily of her anxiousness, what she does to combat this, focusing on what makes her happy and calm outside of THC. Pt to f/u in 1-2 wks w/ Dr. Claiborne Billings.  GAD 7 of 19/21

## 2013-11-16 ENCOUNTER — Ambulatory Visit: Payer: 59 | Admitting: Family Medicine

## 2013-11-25 ENCOUNTER — Encounter: Payer: Self-pay | Admitting: Family Medicine

## 2013-11-25 ENCOUNTER — Ambulatory Visit (INDEPENDENT_AMBULATORY_CARE_PROVIDER_SITE_OTHER): Payer: 59 | Admitting: Family Medicine

## 2013-11-25 VITALS — BP 130/66 | HR 91 | Temp 99.2°F | Ht 68.0 in | Wt 304.0 lb

## 2013-11-25 DIAGNOSIS — F341 Dysthymic disorder: Secondary | ICD-10-CM

## 2013-11-25 DIAGNOSIS — F418 Other specified anxiety disorders: Secondary | ICD-10-CM

## 2013-11-25 MED ORDER — VENLAFAXINE HCL ER 37.5 MG PO CP24: 37.5000 mg | ORAL_CAPSULE | Freq: Every day | ORAL | Status: DC

## 2013-11-25 MED ORDER — VENLAFAXINE HCL ER 75 MG PO CP24: 75.0000 mg | ORAL_CAPSULE | Freq: Every day | ORAL | Status: DC

## 2013-11-25 NOTE — Assessment & Plan Note (Signed)
Start Effexor taper today. Patient is take 37.5 mg for 7 days, then 75 mg a day thereafter. Taper explained to patient with full understanding. Counseled patient on possible side effects of medication. Followup in 3-4 weeks, at that time if she is doing well we'll give her 90 day prescriptions for refills

## 2013-11-25 NOTE — Patient Instructions (Addendum)
Venlafaxine extended-release capsules What is this medicine? VENLAFAXINE(VEN la fax een) is used to treat depression, anxiety and panic disorder. This medicine may be used for other purposes; ask your health care provider or pharmacist if you have questions. COMMON BRAND NAME(S): Effexor XR What should I tell my health care provider before I take this medicine? They need to know if you have any of these conditions: -bleeding disorders -glaucoma -heart disease -high blood pressure -high cholesterol -kidney disease -liver disease -low levels of sodium in the blood -mania or bipolar disorder -seizures -suicidal thoughts, plans, or attempt; a previous suicide attempt by you or a family -take medicines that treat or prevent blood clots -thyroid disease -an unusual or allergic reaction to venlafaxine, desvenlafaxine, other medicines, foods, dyes, or preservatives -pregnant or trying to get pregnant -breast-feeding How should I use this medicine? Take this medicine by mouth with a full glass of water. Follow the directions on the prescription label. Do not cut, crush, or chew this medicine. Take it with food. If needed, the capsule may be carefully opened and the entire contents sprinkled on a spoonful of cool applesauce. Swallow the applesauce/pellet mixture right away without chewing and follow with a glass of water to ensure complete swallowing of the pellets. Try to take your medicine at about the same time each day. Do not take your medicine more often than directed. Do not stop taking this medicine suddenly except upon the advice of your doctor. Stopping this medicine too quickly may cause serious side effects or your condition may worsen. A special MedGuide will be given to you by the pharmacist with each prescription and refill. Be sure to read this information carefully each time. Talk to your pediatrician regarding the use of this medicine in children. Special care may be  needed. Overdosage: If you think you have taken too much of this medicine contact a poison control center or emergency room at once. NOTE: This medicine is only for you. Do not share this medicine with others. What if I miss a dose? If you miss a dose, take it as soon as you can. If it is almost time for your next dose, take only that dose. Do not take double or extra doses. What may interact with this medicine? Do not take this medicine with any of the following medications: -cisapride -desvenlafaxine -dofetilide -dronedarone -duloxetine -levomilnacipran -linezolid -MAOIs like Carbex, Eldepryl, Marplan, Nardil, and Parnate -methylene blue (injected into a vein) -milnacipran -pimozide -thioridazine -ziprasidone This medicine may also interact with the following medications: -aspirin and aspirin-like medicines -certain medicines for depression, anxiety, or psychotic disturbances -certain medicines for migraine headaches like almotriptan, eletriptan, frovatriptan, naratriptan, rizatriptan, sumatriptan, zolmitriptan -cimetidine -clozapine -diuretics -fentanyl -furazolidone -indinavir -isoniazid -ketoconazole -lithium -medicines for sleep -medicines that treat or prevent blood clots like warfarin, enoxaparin, and dalteparin -metoprolol -NSAIDS, medicines for pain and inflammation, like ibuprofen or naproxen -other medicines that prolong the QT interval (cause an abnormal heart rhythm) -procarbazine -rasagiline -supplements like St. John's wort, kava kava, valerian -tramadol -tryptophan This list may not describe all possible interactions. Give your health care provider a list of all the medicines, herbs, non-prescription drugs, or dietary supplements you use. Also tell them if you smoke, drink alcohol, or use illegal drugs. Some items may interact with your medicine. What should I watch for while using this medicine? Tell your doctor if your symptoms do not get better or if  they get worse. Visit your doctor or health care professional for regular checks on  your progress. Because it may take several weeks to see the full effects of this medicine, it is important to continue your treatment as prescribed by your doctor. Patients and their families should watch out for new or worsening thoughts of suicide or depression. Also watch out for sudden changes in feelings such as feeling anxious, agitated, panicky, irritable, hostile, aggressive, impulsive, severely restless, overly excited and hyperactive, or not being able to sleep. If this happens, especially at the beginning of treatment or after a change in dose, call your health care professional. This medicine can cause an increase in blood pressure. Check with your doctor for instructions on monitoring your blood pressure while taking this medicine. You may get drowsy or dizzy. Do not drive, use machinery, or do anything that needs mental alertness until you know how this medicine affects you. Do not stand or sit up quickly, especially if you are an older patient. This reduces the risk of dizzy or fainting spells. Alcohol may interfere with the effect of this medicine. Avoid alcoholic drinks. Your mouth may get dry. Chewing sugarless gum, sucking hard candy and drinking plenty of water will help. Contact your doctor if the problem does not go away or is severe. What side effects may I notice from receiving this medicine? Side effects that you should report to your doctor or health care professional as soon as possible: -allergic reactions like skin rash, itching or hives, swelling of the face, lips, or tongue -breathing problems -changes in vision -hallucination, loss of contact with reality -seizures -suicidal thoughts or other mood changes -trouble passing urine or change in the amount of urine -unusual bleeding or bruising Side effects that usually do not require medical attention (report to your doctor or health care  professional if they continue or are bothersome): -change in sex drive or performance -constipation -increased sweating -loss of appetite -nausea -tremors -weight loss This list may not describe all possible side effects. Call your doctor for medical advice about side effects. You may report side effects to FDA at 1-800-FDA-1088. Where should I keep my medicine? Keep out of the reach of children. Store at a controlled temperature between 20 and 25 degrees C (68 degrees and 77 degrees F), in a dry place. Throw away any unused medicine after the expiration date. NOTE: This sheet is a summary. It may not cover all possible information. If you have questions about this medicine, talk to your doctor, pharmacist, or health care provider.  2014, Elsevier/Gold Standard. (2012-10-28 16:39:53)   I will want to see you back in approximately 3-4 weeks. We will then talk about how you're feeling and at that time I think it view of refills for 3 months at that time.

## 2013-11-25 NOTE — Progress Notes (Signed)
   Subjective:    Patient ID: Vanessa Leonard, female    DOB: 14-Nov-1991, 22 y.o.   MRN: [id number]  HPI  Anxiety: Patient states that she is not feeling as emotional as prior exam. She states she is having a lot of stressors occurring at the same time with school, all work and family. She states she gets aggravated and irritated quickly. She has a history of marijuana use daily and has been attempting to cut back. She states that the reason she is coming back is contagious to have any interest in smoking any longer. She states she doesn't like it. She also just has not felt like herself or feel like doing activities that she use to find enjoyment in since last August. She feels that her anxiety and depression are making it somewhat difficult for her to function in her daily life. GAD score today of 15 and PHQ score of 14. Patient is open to the idea of medications to help her with her depression and anxiety. No SI or HI  Review of Systems Negative, with the exception of above mentioned in HPI  Objective:   Physical Exam BP 130/66  Pulse 91  Temp(Src) 99.2 F (37.3 C) (Oral)  Ht 5\' 8"  (1.727 m)  Wt 304 lb (137.893 kg)  BMI 46.23 kg/m2 Gen: NAD. Appears mildly depressed and tearful at times during conversation. CV: RRR  Chest: CTAB, no wheeze or crackles Psych: Normal dress and affect and demeanor. Appears mildly depressed and tearful during times of her conversation. Gets aggravated when speaking about her family.

## 2013-12-08 ENCOUNTER — Ambulatory Visit (INDEPENDENT_AMBULATORY_CARE_PROVIDER_SITE_OTHER): Payer: 59 | Admitting: Family Medicine

## 2013-12-08 ENCOUNTER — Encounter: Payer: Self-pay | Admitting: Family Medicine

## 2013-12-08 VITALS — BP 141/86 | HR 63 | Temp 98.0°F | Ht 68.0 in | Wt 303.6 lb

## 2013-12-08 DIAGNOSIS — F341 Dysthymic disorder: Secondary | ICD-10-CM

## 2013-12-08 DIAGNOSIS — F418 Other specified anxiety disorders: Secondary | ICD-10-CM

## 2013-12-08 DIAGNOSIS — M5380 Other specified dorsopathies, site unspecified: Secondary | ICD-10-CM

## 2013-12-08 DIAGNOSIS — M62830 Muscle spasm of back: Secondary | ICD-10-CM

## 2013-12-08 MED ORDER — VENLAFAXINE HCL ER 75 MG PO CP24: 75.0000 mg | ORAL_CAPSULE | Freq: Every day | ORAL | Status: DC

## 2013-12-08 MED ORDER — CYCLOBENZAPRINE HCL 10 MG PO TABS: 5.0000 mg | ORAL_TABLET | Freq: Two times a day (BID) | ORAL | Status: DC | PRN

## 2013-12-08 NOTE — Patient Instructions (Signed)
Use a heating pad on your back. I have prescribed flexeril for you to take at night and when you are around the house to help muscles relax. I also want you to take 600 mg advil every 6-8 hours for 3- 5 days.  If you have no improvement in 2 weeks, then we may need to look into Physical therpay  I am glad you are doing better with your anxiety and I have called in 90 days supple. It is important to discuss with me prior to stopping medications. There is Side effects to stopping Effexor abruptly.

## 2013-12-08 NOTE — Assessment & Plan Note (Signed)
Prescribe Flexeril. Recommend using heating pad since injury appears to be a few days old. Told patient to take 600 mg of ibuprofen every 6-8 hours for the next 2-3 days. Likely trapezius strain. Plan the patient the nature of the injury another likely take a few weeks for her to get full range of motion and comfort back. If she does not experience improvement over the next week she is to return in which then I would prescribe physical therapy for her. Although this will be difficult and she is back at school.
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
## 2013-12-16 ENCOUNTER — Telehealth: Payer: Self-pay | Admitting: Family Medicine

## 2013-12-16 NOTE — Telephone Encounter (Signed)
Pt left forms from [employer] to be completed and mailed to [employer]

## 2013-12-17 ENCOUNTER — Ambulatory Visit: Payer: 59 | Admitting: Family Medicine

## 2013-12-17 NOTE — Telephone Encounter (Signed)
Forms were placed in Dr Alan RipperKuneff's box to complete. Vanessa Leonard, Virgel BouquetGiovanna S

## 2013-12-23 NOTE — Telephone Encounter (Signed)
Forms are completed and given to Asha/jeanette. Thanks.

## 2013-12-23 NOTE — Telephone Encounter (Signed)
Left message on patients voicemail letting her know forms were completed and placed upfront. A copy was also place to be scanned. Aadam Zhen, Virgel BouquetGiovanna S

## 2014-01-20 ENCOUNTER — Ambulatory Visit: Payer: 59

## 2014-02-10 ENCOUNTER — Ambulatory Visit (INDEPENDENT_AMBULATORY_CARE_PROVIDER_SITE_OTHER): Payer: Managed Care, Other (non HMO) | Admitting: *Deleted

## 2014-02-10 DIAGNOSIS — Z3009 Encounter for other general counseling and advice on contraception: Secondary | ICD-10-CM

## 2014-02-10 LAB — POCT URINE PREGNANCY: Preg Test, Ur: NEGATIVE

## 2014-02-10 MED ORDER — MEDROXYPROGESTERONE ACETATE 150 MG/ML IM SUSP
150.0000 mg | Freq: Once | INTRAMUSCULAR | Status: AC
  Administered 2014-02-10: 150 mg via INTRAMUSCULAR

## 2014-02-10 NOTE — Progress Notes (Signed)
   Pt late for Depo Provera injection.  Pregnancy test ordered, results negative.  Last sex 2 days ago.  Encourage another reliable form of birthcontrol x 7 days.  Condoms offered, declined.  Pt tolerated Depo injection. Depo given Right upper outer quadrant.  Next injection due June 3 - May 12, 2014.  Reminder card given. Clovis PuMartin, Tamika L, RN

## 2014-02-11 ENCOUNTER — Ambulatory Visit (INDEPENDENT_AMBULATORY_CARE_PROVIDER_SITE_OTHER): Payer: Managed Care, Other (non HMO) | Admitting: *Deleted

## 2014-02-11 VITALS — BP 140/80

## 2014-02-11 DIAGNOSIS — Z136 Encounter for screening for cardiovascular disorders: Secondary | ICD-10-CM

## 2014-02-11 DIAGNOSIS — Z0130 Encounter for examination of blood pressure without abnormal findings: Secondary | ICD-10-CM

## 2014-02-11 NOTE — Progress Notes (Signed)
   Pt in clinic for blood pressure check.  Blood pressure today 140/80 manually.  Pt stated she took blood pressure at home with machine it read 147/103 and 147/98. Pt stated she did have a headache yesterday.  Pt denies headache today or any other symptoms.  Vanessa Leonard, Tamika L, RN

## 2014-03-03 ENCOUNTER — Ambulatory Visit (INDEPENDENT_AMBULATORY_CARE_PROVIDER_SITE_OTHER): Payer: Managed Care, Other (non HMO) | Admitting: Family Medicine

## 2014-03-03 ENCOUNTER — Encounter: Payer: Self-pay | Admitting: Family Medicine

## 2014-03-03 VITALS — BP 116/73 | HR 64 | Temp 98.0°F | Ht 68.0 in | Wt 291.0 lb

## 2014-03-03 DIAGNOSIS — M62830 Muscle spasm of back: Secondary | ICD-10-CM

## 2014-03-03 DIAGNOSIS — M5380 Other specified dorsopathies, site unspecified: Secondary | ICD-10-CM

## 2014-03-03 MED ORDER — IBUPROFEN 600 MG PO TABS: 600.0000 mg | ORAL_TABLET | Freq: Four times a day (QID) | ORAL | Status: DC | PRN

## 2014-03-03 MED ORDER — KETOROLAC TROMETHAMINE 60 MG/2ML IM SOLN
60.0000 mg | Freq: Once | INTRAMUSCULAR | Status: AC
  Administered 2014-03-03: 60 mg via INTRAMUSCULAR

## 2014-03-03 MED ORDER — CYCLOBENZAPRINE HCL 10 MG PO TABS: 10.0000 mg | ORAL_TABLET | Freq: Two times a day (BID) | ORAL | Status: DC | PRN

## 2014-03-03 NOTE — Assessment & Plan Note (Addendum)
Flare up of thoracic back pain. Likely from muscle spasm.  Treat with toradol in office and flexeril and ibuprofen at home.  Red flags for return reviewed No need for imaging at this time since acute on chronic.

## 2014-03-03 NOTE — Patient Instructions (Signed)
  I think this is from a muscle spasm. Continue exercising and doing your regular activities. No heavy lifting.  Take the ibuprofen and flexeril for pain. You can also apply heating pads to the area.  Return to care if worst, if weakness in your legs.

## 2014-03-03 NOTE — Progress Notes (Signed)
Patient ID: Vanessa Leonard    DOB: 1990-12-09, 22 y.o.   MRN: [id number] --- Subjective:  Vanessa is a 23 y.o.female who presents as same day appointment for evaluation of left mid back pain. She experienced pain yesterday after getting out of bed abruptly. Pain has been intermittent since. Worst with bending or standing up too fast. Better with tylenol extra strength. Difficulty sleeping. Symptoms stable since onset. She had associated spasms in the back. Pain is mainly located to the left middle aspect but shifts up and down with movement. No lower ext weakness. No bowel or urine loss. Has had back pain in the past that responded to flexeril and ibuprofen  ROS: see HPI Past Medical History: reviewed and updated medications and allergies. Social History: Tobacco:  Former smoker  Objective: Filed Vitals:   03/03/14 1029  BP: 116/73  Pulse: 64  Temp: 98 F (36.7 C)    Physical Examination:   General appearance - alert, well appearing, and in no distress Back - some dicomfort with flexion and extension, normal right lateral bend, pain with left lateral bend Tenderness to palpation along left thoracic paralumbar muscles. No spinal tenderness 5/5 strength with knee extension, flexion, foot dorsiflexion and plantarflexion, normal hip flexion bilaterally

## 2014-03-03 NOTE — Addendum Note (Signed)
Addended by: Radene OuSOMERVILLE, Porshia Blizzard L on: 03/03/2014 11:31 AM   Modules accepted: Orders

## 2014-04-28 ENCOUNTER — Ambulatory Visit: Payer: Managed Care, Other (non HMO)

## 2014-06-14 ENCOUNTER — Ambulatory Visit: Payer: Managed Care, Other (non HMO)

## 2014-06-15 ENCOUNTER — Ambulatory Visit: Payer: Managed Care, Other (non HMO)

## 2014-07-26 ENCOUNTER — Ambulatory Visit (INDEPENDENT_AMBULATORY_CARE_PROVIDER_SITE_OTHER): Payer: 59 | Admitting: *Deleted

## 2014-07-26 ENCOUNTER — Encounter: Payer: Self-pay | Admitting: Family Medicine

## 2014-07-26 ENCOUNTER — Ambulatory Visit (INDEPENDENT_AMBULATORY_CARE_PROVIDER_SITE_OTHER): Payer: Managed Care, Other (non HMO) | Admitting: Family Medicine

## 2014-07-26 VITALS — BP 139/63 | HR 51 | Ht 68.0 in | Wt 285.9 lb

## 2014-07-26 DIAGNOSIS — Z3042 Encounter for surveillance of injectable contraceptive: Secondary | ICD-10-CM

## 2014-07-26 DIAGNOSIS — Z3009 Encounter for other general counseling and advice on contraception: Secondary | ICD-10-CM | POA: Diagnosis not present

## 2014-07-26 DIAGNOSIS — Z3049 Encounter for surveillance of other contraceptives: Secondary | ICD-10-CM

## 2014-07-26 DIAGNOSIS — J309 Allergic rhinitis, unspecified: Secondary | ICD-10-CM

## 2014-07-26 DIAGNOSIS — J302 Other seasonal allergic rhinitis: Secondary | ICD-10-CM | POA: Insufficient documentation

## 2014-07-26 LAB — POCT URINE PREGNANCY: PREG TEST UR: NEGATIVE

## 2014-07-26 MED ORDER — FLUTICASONE PROPIONATE 50 MCG/ACT NA SUSP: 2.0000 | Freq: Every day | NASAL | Status: DC

## 2014-07-26 MED ORDER — MEDROXYPROGESTERONE ACETATE 150 MG/ML IM SUSP
150.0000 mg | Freq: Once | INTRAMUSCULAR | Status: AC
  Administered 2014-07-26: 150 mg via INTRAMUSCULAR

## 2014-07-26 NOTE — Assessment & Plan Note (Signed)
Patient seasonal allergies. Does not feel like Zyrtec as as effective. She states that she's taken Flonase and feels like that's more effective for her. Prescribe Flonase x1, for 1 month.

## 2014-07-26 NOTE — Patient Instructions (Signed)
Fluticasone nasal spray (Flonase) What is this medicine? FLUTICASONE (floo TIK a sone) is a corticosteroid. This medicine is used to treat the symptoms of allergies like sneezing, itchy red eyes, and itchy, runny, or stuffy nose. This medicine may be used for other purposes; ask your health care provider or pharmacist if you have questions. COMMON BRAND NAME(S): Flonase, Flonase Allergy Relief, Veramyst What should I tell my health care provider before I take this medicine? They need to know if you have any of these conditions: -infection, like tuberculosis, herpes, or fungal infection -recent surgery on nose or sinuses -taking corticosteroid by mouth -an unusual or allergic reaction to fluticasone, steroids, other medicines, foods, dyes, or preservatives -pregnant or trying to get pregnant -breast-feeding How should I use this medicine? This medicine is for use in the nose. Follow the directions on your product or prescription label. This medicine works best if used at regular intervals. Do not use more often than directed. Make sure that you are using your nasal spray correctly. After 6 months of daily use without a prescription, talk to your doctor or health care professional before using it for a longer time. Ask your doctor or health care professional if you have any questions. Talk to your pediatrician regarding the use of this medicine in children. While this drug may be used for children as young as 4 years old for selected conditions, precautions do apply. After 2 months of daily use without a prescription in a child, talk to your pediatrician before using it for a longer time. Overdosage: If you think you have taken too much of this medicine contact a poison control center or emergency room at once. NOTE: This medicine is only for you. Do not share this medicine with others. What if I miss a dose? If you miss a dose, use it as soon as you remember. If it is almost time for your next  dose, use only that dose and continue with your regular schedule. Do not use double or extra doses. What may interact with this medicine? -ketoconazole -metyrapone -some medicines for HIV -vaccines This list may not describe all possible interactions. Give your health care provider a list of all the medicines, herbs, non-prescription drugs, or dietary supplements you use. Also tell them if you smoke, drink alcohol, or use illegal drugs. Some items may interact with your medicine. What should I watch for while using this medicine? Visit your doctor or health care professional for regular checks on your progress. Some symptoms may improve within 12 hours after starting use. Check with your doctor or health care professional if there is no improvement in your condition after 3 weeks of use. Do not come in contact with people who have chickenpox or the measles while you are taking this medicine. If you do, call your doctor right away. What side effects may I notice from receiving this medicine? Side effects that you should report to your doctor or health care professional as soon as possible: -allergic reactions like skin rash, itching or hives, swelling of the face, lips, or tongue -changes in vision -flu-like symptoms -white patches or sores in the mouth or nose Side effects that usually do not require medical attention (report to your doctor or health care professional if they continue or are bothersome): -burning or irritation inside the nose or throat -cough -headache -nosebleed -unusual taste or smell This list may not describe all possible side effects. Call your doctor for medical advice about side effects. You may   report side effects to FDA at 1-800-FDA-1088. Where should I keep my medicine? Keep out of the reach of children. Store at room temperature between 15 and 30 degrees C (59 and 86 degrees F). Throw away any unused medicine after the expiration date. NOTE: This sheet is a  summary. It may not cover all possible information. If you have questions about this medicine, talk to your doctor, pharmacist, or health care provider.  2015, Elsevier/Gold Standard. (2014-03-04 15:55:20)  It was a pleasure seeing you again, I hope you're feeling better.

## 2014-07-26 NOTE — Progress Notes (Signed)
   Subjective:    Patient ID: Vanessa Leonard, female    DOB: 1991/07/13, 23 y.o.   MRN: [id number]  HPI Vanessa Leonard is a 23 y.o. presents to same-day clinic for allergy like symptoms.  Allergy like symptoms: Patient states for the last 2-3 weeks she has noticed an increase in her itchy watery eyes, along with nose irritation and rhinorrhea. In addition her throat has become a little itchy as well. She has a history of allergies in the past of which use to take her, that she feels like was not helping as well. She has taken Flonase as well but she feels that it has worked better for her. She denies any fever, nausea, vomiting or diarrhea. She denies ear pain.  Depo-Provera: Patient is due for Depo-Provera shot today.  Past medical history: Former smoker. Depression, ADD, obesity.    Medication List       This list is accurate as of: 07/26/14 11:19 AM.  Always use your most recent med list.               cyclobenzaprine 10 MG tablet  Commonly known as:  FLEXERIL  Take 1 tablet (10 mg total) by mouth 2 (two) times daily as needed for muscle spasms.     fluticasone 50 MCG/ACT nasal spray  Commonly known as:  FLONASE  Place 2 sprays into both nostrils daily.     ibuprofen 600 MG tablet  Commonly known as:  ADVIL,MOTRIN  Take 1 tablet (600 mg total) by mouth every 6 (six) hours as needed.     medroxyPROGESTERone 150 MG/ML injection  Commonly known as:  DEPO-PROVERA  Inject 150 mg into the muscle every 3 (three) months.     venlafaxine XR 75 MG 24 hr capsule  Commonly known as:  EFFEXOR XR  Take 1 capsule (75 mg total) by mouth daily with breakfast.       Review of Systems Per history of present illness    Objective:   Physical Exam BP 139/63  Pulse 51  Ht  (1.727 m)  Wt 285 lb 14.4 oz (129.683 kg)  BMI 43.48 kg/m2 Gen: Pleasant African American female, obese, no acute distress, nontoxic in appearance, well-developed, well-nourished. HEENT: AT. . Bilateral TM  visualized and pale in appearance. Bilateral eyes without injections or icterus. MMM. Bilateral nares pale with mild erythema. Throat without erythema or exudates.  Neck: No lymphadenopathy Chest: CTAB, no wheeze or crackles     Assessment & Plan:

## 2014-07-26 NOTE — Assessment & Plan Note (Signed)
Urine pregnancy test negative. Depo-Provera shot given today.

## 2014-07-26 NOTE — Progress Notes (Signed)
Patient in today for depo-provera. Negative pregnancy test obtained, depo given in right ventrogluteal, patient without complaints. Next depo due November 16 - November 30, patient aware.

## 2014-10-18 ENCOUNTER — Ambulatory Visit (INDEPENDENT_AMBULATORY_CARE_PROVIDER_SITE_OTHER): Payer: 59 | Admitting: *Deleted

## 2014-10-18 DIAGNOSIS — Z3042 Encounter for surveillance of injectable contraceptive: Secondary | ICD-10-CM

## 2014-10-18 MED ORDER — MEDROXYPROGESTERONE ACETATE 150 MG/ML IM SUSP
150.0000 mg | INTRAMUSCULAR | Status: DC
  Administered 2014-10-18: 150 mg via INTRAMUSCULAR

## 2014-10-18 NOTE — Progress Notes (Signed)
Patient here today for Depo Provera injection.  Depo given today LUOQ.  Site unremarkable & patient tolerated injection.  Next injection due Feb. 8-22, 2016.  Reminder card given.  Altamese Dilling~Jeannette Richardson, BSN, RN-BC

## 2014-11-30 ENCOUNTER — Ambulatory Visit: Payer: Self-pay | Admitting: Family Medicine

## 2015-02-16 ENCOUNTER — Ambulatory Visit (INDEPENDENT_AMBULATORY_CARE_PROVIDER_SITE_OTHER): Payer: 59 | Admitting: Family Medicine

## 2015-02-16 ENCOUNTER — Encounter: Payer: Self-pay | Admitting: Family Medicine

## 2015-02-16 VITALS — BP 119/86 | HR 82 | Temp 98.5°F | Wt 280.0 lb

## 2015-02-16 DIAGNOSIS — J302 Other seasonal allergic rhinitis: Secondary | ICD-10-CM | POA: Diagnosis not present

## 2015-02-16 DIAGNOSIS — Z23 Encounter for immunization: Secondary | ICD-10-CM

## 2015-02-16 MED ORDER — FLUTICASONE PROPIONATE 50 MCG/ACT NA SUSP: 2.0000 | Freq: Every day | NASAL | Status: DC

## 2015-02-16 MED ORDER — CETIRIZINE HCL 10 MG PO TABS: 10.0000 mg | ORAL_TABLET | Freq: Every day | ORAL | Status: DC

## 2015-02-16 NOTE — Patient Instructions (Signed)
It was nice to meet you today.  I think all of your symptoms are related to allergies.  Start taking Zyrtec daily and using Flonase for the allergies.  Take care, Dr. B  Allergic Rhinitis Allergic rhinitis is when the mucous membranes in the nose respond to allergens. Allergens are particles in the air that cause your body to have an allergic reaction. This causes you to release allergic antibodies. Through a chain of events, these eventually cause you to release histamine into the blood stream. Although meant to protect the body, it is this release of histamine that causes your discomfort, such as frequent sneezing, congestion, and an itchy, runny nose.  CAUSES  Seasonal allergic rhinitis (hay fever) is caused by pollen allergens that may come from grasses, trees, and weeds. Year-round allergic rhinitis (perennial allergic rhinitis) is caused by allergens such as house dust mites, pet dander, and mold spores.  SYMPTOMS   Nasal stuffiness (congestion).  Itchy, runny nose with sneezing and tearing of the eyes. DIAGNOSIS  Your health care provider can help you determine the allergen or allergens that trigger your symptoms. If you and your health care provider are unable to determine the allergen, skin or blood testing may be used. TREATMENT  Allergic rhinitis does not have a cure, but it can be controlled by:  Medicines and allergy shots (immunotherapy).  Avoiding the allergen. Hay fever may often be treated with antihistamines in pill or nasal spray forms. Antihistamines block the effects of histamine. There are over-the-counter medicines that may help with nasal congestion and swelling around the eyes. Check with your health care provider before taking or giving this medicine.  If avoiding the allergen or the medicine prescribed do not work, there are many new medicines your health care provider can prescribe. Stronger medicine may be used if initial measures are ineffective. Desensitizing  injections can be used if medicine and avoidance does not work. Desensitization is when a patient is given ongoing shots until the body becomes less sensitive to the allergen. Make sure you follow up with your health care provider if problems continue. HOME CARE INSTRUCTIONS It is not possible to completely avoid allergens, but you can reduce your symptoms by taking steps to limit your exposure to them. It helps to know exactly what you are allergic to so that you can avoid your specific triggers. SEEK MEDICAL CARE IF:   You have a fever.  You develop a cough that does not stop easily (persistent).  You have shortness of breath.  You start wheezing.  Symptoms interfere with normal daily activities. Document Released: 08/07/2001 Document Revised: 11/17/2013 Document Reviewed: 07/20/2013 [facility] Patient Information 2015 [place], [place]. This information is not intended to replace advice given to you by your health care provider. Make sure you discuss any questions you have with your health care provider.

## 2015-02-16 NOTE — Assessment & Plan Note (Signed)
Symptoms consistent with allergic rhinitis Start Zyrtec daily and Flonase Return precautions given

## 2015-02-16 NOTE — Progress Notes (Signed)
   Subjective:   Vanessa Leonard is a 24 y.o. female with a history of seasonal allergies, obesity here for sore throat.  - Post-nasal drip, nasal congestion, stuffy ears, and intermittent nonproductive cough since early this month - sore throat starting last night and worse this AM - no fevers - used to use flonase but does not have any currently.  Review of Systems:  Per HPI. All other systems reviewed and are negative.   PMH, PSH, Medications, Allergies, and FmHx reviewed and updated in EMR.  Social History: non smoker  Objective:  BP 119/86 mmHg  Pulse 82  Temp(Src) 98.5 F (36.9 C) (Oral)  Wt 280 lb (127.007 kg)  Gen:  24 y.o. female in NAD HEENT: NCAT, MMM, EOMI, PERRL, anicteric sclerae, TMs clear, OP clear, no tonsillar exudate. CV: RRR, no MRG, no JVD Resp: Non-labored, CTAB, no wheezes noted Ext: WWP, no edema Neuro: Alert and oriented, speech normal     Assessment:     Vanessa Leonard is a 24 y.o. female here for allergic rhinitis with post-nasal drip.    Plan:     See problem list for problem-specific plans.   Erasmo DownerAngela M Eldonna Neuenfeldt, MD PGY-1,  [facility] 02/16/2015  3:30 PM

## 2019-12-15 ENCOUNTER — Ambulatory Visit (HOSPITAL_COMMUNITY)
Admission: EM | Admit: 2019-12-15 | Discharge: 2019-12-15 | Disposition: A | Discharge status: Home / Self Care | Payer: Medicaid Other | Attending: Family Medicine | Admitting: Family Medicine

## 2019-12-15 ENCOUNTER — Encounter (HOSPITAL_COMMUNITY): Payer: Self-pay

## 2019-12-15 DIAGNOSIS — M546 Pain in thoracic spine: Secondary | ICD-10-CM | POA: Diagnosis not present

## 2019-12-15 DIAGNOSIS — Z3202 Encounter for pregnancy test, result negative: Secondary | ICD-10-CM | POA: Diagnosis not present

## 2019-12-15 DIAGNOSIS — M62830 Muscle spasm of back: Secondary | ICD-10-CM

## 2019-12-15 LAB — POC URINE PREG, ED: Preg Test, Ur: NEGATIVE

## 2019-12-15 LAB — POCT URINALYSIS DIP (DEVICE)
Bilirubin Urine: NEGATIVE
Glucose, UA: NEGATIVE mg/dL
Hgb urine dipstick: NEGATIVE
Ketones, ur: NEGATIVE mg/dL
Leukocytes,Ua: NEGATIVE
Nitrite: NEGATIVE
Protein, ur: 30 mg/dL — AB
Specific Gravity, Urine: >=1.03 (ref 1.005–1.030)
Urobilinogen, UA: 0.2 mg/dL (ref 0.0–1.0)
pH: 6 (ref 5.0–8.0)

## 2019-12-15 LAB — POCT PREGNANCY, URINE: Preg Test, Ur: NEGATIVE

## 2019-12-15 MED ORDER — CYCLOBENZAPRINE HCL 10 MG PO TABS: 10.0000 mg | ORAL_TABLET | Freq: Two times a day (BID) | ORAL | 0 refills | Status: AC | PRN

## 2019-12-15 MED ORDER — IBUPROFEN 600 MG PO TABS: 600.0000 mg | ORAL_TABLET | Freq: Four times a day (QID) | ORAL | 0 refills | Status: AC | PRN

## 2019-12-15 NOTE — ED Provider Notes (Signed)
[facility]    CSN: [id number] Arrival date & time: 12/15/19  1155      History   Chief Complaint Chief Complaint  Patient presents with  . Flank Pain    HPI Vanessa Leonard is a 29 y.o. female.   Patient is a 29 year old female presents today with left upper back and lower back pain.  Symptoms have been constant, waxing waning over the past 3 days.  She has been taking ibuprofen with some relief.  Reporting the pain has woken her out of her sleep most ibuprofen wears off.  Pain is worse with palpation and movement.  Denies any heavy lifting, injuries or falls.  Denies any cough, chest congestion or fevers.  Denies any chest pain or shortness of breath.  Denies any numbness, tingling, weakness, loss of bowel or bladder function.  Mild urinary frequency.  ROS per HPI      Past Medical History:  Diagnosis Date  . Muscle ache     Patient Active Problem List   Diagnosis Date Noted  . Seasonal allergies 07/26/2014  . Depo-Provera contraceptive status 07/26/2014  . Depression with anxiety 11/25/2013  . Other general counseling and advice for contraceptive management 07/15/2013  . Spasm of back muscles 04/15/2013  . Elevated BP 04/15/2013  . Well child visit 07/03/2012  . Heavy menstrual bleeding 06/27/2012  . OBESITY, MORBID 12/02/2009  . ADD 12/02/2009  . 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 MOUTH SURGERY      OB History   No obstetric history on file.      Home Medications    Prior to Admission medications   Medication Sig Start Date End Date Taking? Authorizing Provider  cyclobenzaprine (FLEXERIL) 10 MG tablet Take 1 tablet (10 mg total) by mouth 2 (two) times daily as needed for muscle spasms. 12/15/19   Loura Halt A, NP  ibuprofen (ADVIL) 600 MG tablet Take 1 tablet (600 mg total) by mouth every 6 (six) hours as needed. 12/15/19   Loura Halt A, NP  medroxyPROGESTERone (DEPO-PROVERA) 150 MG/ML  injection Inject 150 mg into the muscle every 3 (three) months.    [provider]  venlafaxine XR (EFFEXOR XR) 75 MG 24 hr capsule Take 1 capsule (75 mg total) by mouth daily with breakfast. 12/08/13   Kuneff, Renee A, DO  cetirizine (ZYRTEC) 10 MG tablet Take 1 tablet (10 mg total) by mouth daily. 02/16/15 12/15/19  Virginia Crews, MD  fluticasone (FLONASE) 50 MCG/ACT nasal spray Place 2 sprays into both nostrils daily. 02/16/15 12/15/19  Virginia Crews, MD    Family History History reviewed. No pertinent family history.  Social History Social History   Tobacco Use  . Smoking status: Former Research scientist (life sciences)  . Smokeless tobacco: Never Used  Substance Use Topics  . Alcohol use: No  . Drug use: Yes    Types: Marijuana     Allergies   Patient has no known allergies.   Review of Systems Review of Systems   Physical Exam Triage Vital Signs ED Triage Vitals [12/15/19 1242]  Enc Vitals Group     BP (!) 119/53     Pulse Rate 77     Resp 18     Temp 98.3 F (36.8 C)     Temp src      SpO2      Weight      Height      Head Circumference  Peak Flow      Pain Score 10     Pain Loc      Pain Edu?      Excl. in GC?    No data found.  Updated Vital Signs BP (!) 119/53 (BP Location: Right Arm)   Pulse 77   Temp 98.3 F (36.8 C)   Resp 18   Visual Acuity Right Eye Distance:   Left Eye Distance:   Bilateral Distance:    Right Eye Near:   Left Eye Near:    Bilateral Near:     Physical Exam Vitals and nursing note reviewed.  Constitutional:      General: She is not in acute distress.    Appearance: Normal appearance. She is not ill-appearing, toxic-appearing or diaphoretic.  HENT:     Head: Normocephalic.     Nose: Nose normal.  Eyes:     Conjunctiva/sclera: Conjunctivae normal.  Pulmonary:     Effort: Pulmonary effort is normal.  Musculoskeletal:        General: Normal range of motion.     Cervical back: Normal and normal range of motion.      Thoracic back: Tenderness present. No spasms or bony tenderness. Normal range of motion.     Lumbar back: Tenderness present. Normal range of motion.       Back:  Skin:    General: Skin is warm and dry.  Neurological:     Mental Status: She is alert.  Psychiatric:        Mood and Affect: Mood normal.      UC Treatments / Results  Labs (all labs ordered are listed, but only abnormal results are displayed) Labs Reviewed  POCT URINALYSIS DIP (DEVICE) - Abnormal; Notable for the following components:      Result Value   Protein, ur 30 (*)    All other components within normal limits  POC URINE PREG, ED  POCT PREGNANCY, URINE    EKG   Radiology No results found.  Procedures Procedures (including critical care time)  Medications Ordered in UC Medications - No data to display  Initial Impression / Assessment and Plan / UC Course  I have reviewed the triage vital signs and the nursing notes.  Pertinent labs & imaging results that were available during my care of the patient were reviewed by me and considered in my medical decision making (see chart for details).     Back pain-most likely musculoskeletal related.  High suspicion that this back pain is due to needing a more supportive bra for very large breasts.  Recommended wearing 2 sports bras for better support to see if this helps. In the meantime we can do ibuprofen every 6 hours and Flexeril as needed Gentle stretching, heat to the areas and massage can help. Follow up as needed for continued or worsening symptoms  Final Clinical Impressions(s) / UC Diagnoses   Final diagnoses:  Acute thoracic back pain, unspecified back pain laterality     Discharge Instructions     Take the medication as prescribed. Try doing heat to the area and gentle stretching.  Try working on your posture.  Recommend wearing 2 sports bra to support your breast. Follow up as needed for continued or worsening symptoms     ED  Prescriptions    Medication Sig Dispense Auth. Provider   cyclobenzaprine (FLEXERIL) 10 MG tablet Take 1 tablet (10 mg total) by mouth 2 (two) times daily as needed for muscle spasms. 30 tablet [place],  Zakaiya Lares A, NP   ibuprofen (ADVIL) 600 MG tablet Take 1 tablet (600 mg total) by mouth every 6 (six) hours as needed. 60 tablet Raquel Racey A, NP     PDMP not reviewed this encounter.   Janace Aris, NP 12/15/19 1325

## 2019-12-15 NOTE — ED Triage Notes (Signed)
Pt present Flank pain on both side where her kidney are located. Symptoms started  Three days ago. Pt states she is going to the bathroom more frequently

## 2019-12-15 NOTE — Discharge Instructions (Addendum)
Take the medication as prescribed. Try doing heat to the area and gentle stretching.  Try working on your posture.  Recommend wearing 2 sports bra to support your breast. Follow up as needed for continued or worsening symptoms

## 2020-09-22 ENCOUNTER — Encounter: Payer: Self-pay | Admitting: Emergency Medicine

## 2020-09-22 ENCOUNTER — Ambulatory Visit
Admission: EM | Admit: 2020-09-22 | Discharge: 2020-09-22 | Disposition: A | Discharge status: Home / Self Care | Payer: Medicaid Other | Attending: Emergency Medicine | Admitting: Emergency Medicine

## 2020-09-22 ENCOUNTER — Other Ambulatory Visit: Payer: Self-pay

## 2020-09-22 DIAGNOSIS — Z113 Encounter for screening for infections with a predominantly sexual mode of transmission: Secondary | ICD-10-CM | POA: Insufficient documentation

## 2020-09-22 DIAGNOSIS — Z7251 High risk heterosexual behavior: Secondary | ICD-10-CM | POA: Diagnosis present

## 2020-09-22 DIAGNOSIS — Z3202 Encounter for pregnancy test, result negative: Secondary | ICD-10-CM | POA: Diagnosis not present

## 2020-09-22 LAB — POCT URINE PREGNANCY: Preg Test, Ur: NEGATIVE

## 2020-09-22 MED ORDER — CEFTRIAXONE SODIUM 500 MG IJ SOLR
500.0000 mg | Freq: Once | INTRAMUSCULAR | Status: AC
  Administered 2020-09-22: 500 mg via INTRAMUSCULAR

## 2020-09-22 MED ORDER — DOXYCYCLINE HYCLATE 100 MG PO CAPS: 100.0000 mg | ORAL_CAPSULE | Freq: Two times a day (BID) | ORAL | 0 refills | Status: AC

## 2020-09-22 NOTE — Discharge Instructions (Addendum)
Today you received treatment for chlamydia and gonorrhea. Testing for chlamydia, gonorrhea, trichomonas is pending: please look for these results on the MyChart app/website.  We will notify you if you are positive and outline treatment at that time.  Important to avoid all forms of sexual intercourse (oral, vaginal, anal) with any/all partners for the next 7 days to avoid spreading/reinfecting. Any/all sexual partners should be notified of testing/treatment today.  Return for persistent/worsening symptoms or if you develop fever, abdominal or pelvic pain, discharge, genital pain, blood in your urine, or are re-exposed to an STI. 

## 2020-09-22 NOTE — ED Provider Notes (Signed)
[facility]    CSN: [id number] Arrival date & time: 09/22/20  2008      History   Chief Complaint Chief Complaint  Patient presents with  . SEXUALLY TRANSMITTED DISEASE    HPI Vanessa Leonard is a 29 y.o. female  Presenting for pregnancy test and STD testing.  Having unprotected intercourse with a woman partner.  Denies known exposure, though states her partner for the last few weeks has been having burning with urination, penile pain.  Patient denies vaginal discharge, pelvic pain, urinary symptoms or fever.  No abdominal pain, back pain.  Requesting pregnancy testing as well.  Past Medical History:  Diagnosis Date  . Muscle ache     Patient Active Problem List   Diagnosis Date Noted  . Seasonal allergies 07/26/2014  . Depo-Provera contraceptive status 07/26/2014  . Depression with anxiety 11/25/2013  . Other general counseling and advice for contraceptive management 07/15/2013  . Spasm of back muscles 04/15/2013  . Elevated BP 04/15/2013  . Well child visit 07/03/2012  . Heavy menstrual bleeding 06/27/2012  . OBESITY, MORBID 12/02/2009  . ADD 12/02/2009  . 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 MOUTH SURGERY      OB History   No obstetric history on file.      Home Medications    Prior to Admission medications   Medication Sig Start Date End Date Taking? Authorizing Provider  cyclobenzaprine (FLEXERIL) 10 MG tablet Take 1 tablet (10 mg total) by mouth 2 (two) times daily as needed for muscle spasms. 12/15/19   Dahlia Byes A, NP  doxycycline (VIBRAMYCIN) 100 MG capsule Take 1 capsule (100 mg total) by mouth 2 (two) times daily for 7 days. 09/22/20 09/29/20  Hall-Potvin, [place], PA-C  ibuprofen (ADVIL) 600 MG tablet Take 1 tablet (600 mg total) by mouth every 6 (six) hours as needed. 12/15/19   Dahlia Byes A, NP  medroxyPROGESTERone (DEPO-PROVERA) 150 MG/ML injection Inject 150 mg into the muscle  every 3 (three) months.    [provider]  venlafaxine XR (EFFEXOR XR) 75 MG 24 hr capsule Take 1 capsule (75 mg total) by mouth daily with breakfast. 12/08/13   Kuneff, Renee A, DO  cetirizine (ZYRTEC) 10 MG tablet Take 1 tablet (10 mg total) by mouth daily. 02/16/15 12/15/19  Erasmo Downer, MD  fluticasone (FLONASE) 50 MCG/ACT nasal spray Place 2 sprays into both nostrils daily. 02/16/15 12/15/19  Erasmo Downer, MD    Family History History reviewed. No pertinent family history.  Social History Social History   Tobacco Use  . Smoking status: Former Games developer  . Smokeless tobacco: Never Used  Substance Use Topics  . Alcohol use: No  . Drug use: Yes    Types: Marijuana     Allergies   Patient has no known allergies.   Review of Systems Review of Systems  Constitutional: Negative for fatigue and fever.  HENT: Negative for ear pain, sinus pain, sore throat and voice change.   Eyes: Negative for pain, redness and visual disturbance.  Respiratory: Negative for cough and shortness of breath.   Cardiovascular: Negative for chest pain and palpitations.  Gastrointestinal: Negative for abdominal pain, diarrhea and vomiting.  Genitourinary: Negative for decreased urine volume, dysuria, frequency, genital sores, pelvic pain, urgency, vaginal bleeding, vaginal discharge and vaginal pain.  Musculoskeletal: Negative for arthralgias and myalgias.  Skin: Negative for rash and wound.  Neurological: Negative for syncope and headaches.  Physical Exam Triage Vital Signs ED Triage Vitals  Enc Vitals Group     BP      Pulse      Resp      Temp      Temp src      SpO2      Weight      Height      Head Circumference      Peak Flow      Pain Score      Pain Loc      Pain Edu?      Excl. in GC?    No data found.  Updated Vital Signs BP 134/85 (BP Location: Left Arm)   Pulse 66   Temp 98.2 F (36.8 C) (Oral)   Resp 20   SpO2 98%   Visual Acuity Right Eye  Distance:   Left Eye Distance:   Bilateral Distance:    Right Eye Near:   Left Eye Near:    Bilateral Near:     Physical Exam Constitutional:      General: She is not in acute distress. HENT:     Head: Normocephalic and atraumatic.  Eyes:     General: No scleral icterus.    Pupils: Pupils are equal, round, and reactive to light.  Cardiovascular:     Rate and Rhythm: Normal rate.  Pulmonary:     Effort: Pulmonary effort is normal.  Abdominal:     General: Bowel sounds are normal.     Palpations: Abdomen is soft.     Tenderness: There is no abdominal tenderness. There is no right CVA tenderness, left CVA tenderness or guarding.  Genitourinary:    Comments: Patient declined, self-swab performed Skin:    Coloration: Skin is not jaundiced or pale.  Neurological:     Mental Status: She is alert and oriented to person, place, and time.      UC Treatments / Results  Labs (all labs ordered are listed, but only abnormal results are displayed) Labs Reviewed  POCT URINE PREGNANCY  CERVICOVAGINAL ANCILLARY ONLY    EKG   Radiology No results found.  Procedures Procedures (including critical care time)  Medications Ordered in UC Medications  cefTRIAXone (ROCEPHIN) injection 500 mg (has no administration in time range)    Initial Impression / Assessment and Plan / UC Course  I have reviewed the triage vital signs and the nursing notes.  Pertinent labs & imaging results that were available during my care of the patient were reviewed by me and considered in my medical decision making (see chart for details).     Given female partner, with whom she has been sexually active greater than 30 days, is currently having symptoms concerning for STI, will treat empirically for G/C.  Tolerated Rocephin well, will pick up doxycycline tonight.  Pregnancy negative.  Reviewed safe sex practices.  Return precautions discussed, pt verbalized understanding and is agreeable to plan. Final  Clinical Impressions(s) / UC Diagnoses   Final diagnoses:  Encounter for pregnancy test with result negative  Unprotected sex  Screening examination for venereal disease     Discharge Instructions     Today you received treatment for chlamydia and gonorrhea. Testing for chlamydia, gonorrhea, trichomonas is pending: please look for these results on the MyChart app/website.  We will notify you if you are positive and outline treatment at that time.  Important to avoid all forms of sexual intercourse (oral, vaginal, anal) with any/all partners for the next 7 days  to avoid spreading/reinfecting. Any/all sexual partners should be notified of testing/treatment today.  Return for persistent/worsening symptoms or if you develop fever, abdominal or pelvic pain, discharge, genital pain, blood in your urine, or are re-exposed to an STI.    ED Prescriptions    Medication Sig Dispense Auth. Provider   doxycycline (VIBRAMYCIN) 100 MG capsule Take 1 capsule (100 mg total) by mouth 2 (two) times daily for 7 days. 14 capsule Hall-Potvin, [place], PA-C     PDMP not reviewed this encounter.   Hall-Potvin, [place], [place] 09/22/20 2045

## 2020-09-22 NOTE — ED Triage Notes (Signed)
Pt requesting preg test and STD. States her partner is having penile discharge.

## 2020-09-26 LAB — CERVICOVAGINAL ANCILLARY ONLY
Chlamydia: POSITIVE — AB
Comment: NEGATIVE
Comment: NEGATIVE
Comment: NORMAL
Neisseria Gonorrhea: POSITIVE — AB
Trichomonas: POSITIVE — AB

## 2020-09-27 ENCOUNTER — Telehealth (HOSPITAL_COMMUNITY): Payer: Self-pay | Admitting: Emergency Medicine

## 2020-09-27 MED ORDER — METRONIDAZOLE 500 MG PO TABS: 500.0000 mg | ORAL_TABLET | Freq: Two times a day (BID) | ORAL | 0 refills | Status: AC

## 2020-12-19 NOTE — Progress Notes (Signed)
Patient did not show for appointment.   

## 2020-12-20 ENCOUNTER — Encounter: Payer: Medicaid Other | Admitting: Family

## 2020-12-20 DIAGNOSIS — N611 Abscess of the breast and nipple: Secondary | ICD-10-CM

## 2020-12-20 DIAGNOSIS — Z7689 Persons encountering health services in other specified circumstances: Secondary | ICD-10-CM

## 2021-04-30 ENCOUNTER — Ambulatory Visit (HOSPITAL_COMMUNITY)
Admission: EM | Admit: 2021-04-30 | Discharge: 2021-04-30 | Disposition: A | Discharge status: Home / Self Care | Payer: Medicaid Other | Attending: Family Medicine | Admitting: Family Medicine

## 2021-04-30 ENCOUNTER — Encounter (HOSPITAL_COMMUNITY): Payer: Self-pay | Admitting: Emergency Medicine

## 2021-04-30 ENCOUNTER — Other Ambulatory Visit: Payer: Self-pay

## 2021-04-30 DIAGNOSIS — N611 Abscess of the breast and nipple: Secondary | ICD-10-CM

## 2021-04-30 MED ORDER — TRAMADOL HCL 50 MG PO TABS: 50.0000 mg | ORAL_TABLET | Freq: Four times a day (QID) | ORAL | 0 refills | Status: DC | PRN

## 2021-04-30 MED ORDER — DOXYCYCLINE HYCLATE 100 MG PO TABS: 100.0000 mg | ORAL_TABLET | Freq: Two times a day (BID) | ORAL | 0 refills | Status: AC

## 2021-04-30 NOTE — ED Triage Notes (Signed)
Pt presents with reoccurring abscess on right breast xs 2 weeks.

## 2021-05-01 ENCOUNTER — Other Ambulatory Visit: Payer: Self-pay | Admitting: Surgery

## 2021-05-03 NOTE — ED Provider Notes (Signed)
[facility]    CSN: [id number] Arrival date & time: 04/30/21  1743      History   Chief Complaint Chief Complaint  Patient presents with  . Abscess    HPI Vanessa Leonard is a 30 y.o. female.   Patient presenting today with 2 weeks of acutely worsening right breast abscess that she states is recurrent. Significant pain, swelling, redness to the area. No active drainage, fever, chills, sweats, injury to area, CP, SOB. So far has been trying warm compresses and OTC pain relievers without benefit.      Past Medical History:  Diagnosis Date  . Muscle ache     Patient Active Problem List   Diagnosis Date Noted  . Seasonal allergies 07/26/2014  . Depo-Provera contraceptive status 07/26/2014  . Depression with anxiety 11/25/2013  . Other general counseling and advice for contraceptive management 07/15/2013  . Spasm of back muscles 04/15/2013  . Elevated BP 04/15/2013  . Well child visit 07/03/2012  . Heavy menstrual bleeding 06/27/2012  . OBESITY, MORBID 12/02/2009  . ADD 12/02/2009  . 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 MOUTH SURGERY      OB History   No obstetric history on file.      Home Medications    Prior to Admission medications   Medication Sig Start Date End Date Taking? Authorizing Provider  doxycycline (VIBRA-TABS) 100 MG tablet Take 1 tablet (100 mg total) by mouth 2 (two) times daily. 04/30/21  Yes Particia Nearing, PA-C  traMADol (ULTRAM) 50 MG tablet Take 1 tablet (50 mg total) by mouth every 6 (six) hours as needed. 04/30/21  Yes Particia Nearing, PA-C  cyclobenzaprine (FLEXERIL) 10 MG tablet Take 1 tablet (10 mg total) by mouth 2 (two) times daily as needed for muscle spasms. 12/15/19   Dahlia Byes A, NP  ibuprofen (ADVIL) 600 MG tablet Take 1 tablet (600 mg total) by mouth every 6 (six) hours as needed. 12/15/19   Dahlia Byes A, NP  medroxyPROGESTERone (DEPO-PROVERA) 150 MG/ML  injection Inject 150 mg into the muscle every 3 (three) months.    [provider]  metroNIDAZOLE (FLAGYL) 500 MG tablet Take 1 tablet (500 mg total) by mouth 2 (two) times daily. 09/27/20   Merrilee Jansky, MD  venlafaxine XR (EFFEXOR XR) 75 MG 24 hr capsule Take 1 capsule (75 mg total) by mouth daily with breakfast. 12/08/13   Kuneff, Renee A, DO  cetirizine (ZYRTEC) 10 MG tablet Take 1 tablet (10 mg total) by mouth daily. 02/16/15 12/15/19  Erasmo Downer, MD  fluticasone (FLONASE) 50 MCG/ACT nasal spray Place 2 sprays into both nostrils daily. 02/16/15 12/15/19  Erasmo Downer, MD    Family History History reviewed. No pertinent family history.  Social History Social History   Tobacco Use  . Smoking status: Former Games developer  . Smokeless tobacco: Never Used  Substance Use Topics  . Alcohol use: No  . Drug use: Yes    Types: Marijuana     Allergies   Patient has no known allergies.   Review of Systems Review of Systems PER HPI    Physical Exam Triage Vital Signs ED Triage Vitals  Enc Vitals Group     BP 04/30/21 1801 (!) 145/90     Pulse Rate 04/30/21 1801 (!) 58     Resp 04/30/21 1801 17     Temp 04/30/21 1801 98.1 F (36.7 C)  Temp Source 04/30/21 1801 Oral     SpO2 04/30/21 1801 98 %     Weight --      Height --      Head Circumference --      Peak Flow --      Pain Score 04/30/21 1800 6     Pain Loc --      Pain Edu? --      Excl. in GC? --    No data found.  Updated Vital Signs BP (!) 145/90 (BP Location: Left Wrist)   Pulse (!) 58   Temp 98.1 F (36.7 C) (Oral)   Resp 17   LMP 04/27/2021   SpO2 98%   Visual Acuity Right Eye Distance:   Left Eye Distance:   Bilateral Distance:    Right Eye Near:   Left Eye Near:    Bilateral Near:     Physical Exam Vitals and nursing note reviewed.  Constitutional:      Appearance: Normal appearance.     Comments: Appears in pain, crying  HENT:     Head: Atraumatic.  Eyes:      Extraocular Movements: Extraocular movements intact.     Conjunctiva/sclera: Conjunctivae normal.  Cardiovascular:     Rate and Rhythm: Normal rate and regular rhythm.     Heart sounds: Normal heart sounds.  Pulmonary:     Effort: Pulmonary effort is normal.     Breath sounds: Normal breath sounds.  Chest:  Breasts:     Right: No axillary adenopathy.    Musculoskeletal:        General: Normal range of motion.     Cervical back: Normal range of motion and neck supple.  Lymphadenopathy:     Upper Body:     Right upper body: No axillary adenopathy.  Skin:    General: Skin is warm and dry.     Findings: Erythema present.     Comments: Right medial breast erythematous, edematous and significantly ttp. No focal area of fluctuance palpable on limited palpation, patient intolerant to exam overall due to pain  Neurological:     Mental Status: She is alert and oriented to person, place, and time.  Psychiatric:        Mood and Affect: Mood normal.        Thought Content: Thought content normal.        Judgment: Judgment normal.      UC Treatments / Results  Labs (all labs ordered are listed, but only abnormal results are displayed) Labs Reviewed - No data to display  EKG   Radiology No results found.  Procedures Procedures (including critical care time)  Medications Ordered in UC Medications - No data to display  Initial Impression / Assessment and Plan / UC Course  I have reviewed the triage vital signs and the nursing notes.  Pertinent labs & imaging results that were available during my care of the patient were reviewed by me and considered in my medical decision making (see chart for details).     No obvious focal area of abscess indicating I and D today and patient's discomfort poses a safety risk for a procedure as she pushes away hand on palpation. Discussed starting abx, hibiclens, warm compresses, and will give small supply of tramadol for prn use for severe  pain with precautions. PDMP reviewed and appropriate. Close f/u with [facility] recommended for recheck and further mgmt particularly given recurrent nature at this time. Strict return precautions given  for worsening sxs.   Final Clinical Impressions(s) / UC Diagnoses   Final diagnoses:  Breast abscess   Discharge Instructions   None    ED Prescriptions    Medication Sig Dispense Auth. Provider   doxycycline (VIBRA-TABS) 100 MG tablet Take 1 tablet (100 mg total) by mouth 2 (two) times daily. 20 tablet Particia Nearing, [place]   traMADol (ULTRAM) 50 MG tablet Take 1 tablet (50 mg total) by mouth every 6 (six) hours as needed. 15 tablet Particia Nearing, [place]     I have reviewed the PDMP during this encounter.   Roosvelt Maser [place], [place] 05/03/21 [phone]

## 2021-05-19 ENCOUNTER — Emergency Department (HOSPITAL_COMMUNITY): Payer: Medicaid Other

## 2021-05-19 ENCOUNTER — Emergency Department (HOSPITAL_COMMUNITY)
Admission: EM | Admit: 2021-05-19 | Discharge: 2021-05-19 | Discharge status: Against Medical Advice | Payer: Medicaid Other | Attending: Emergency Medicine | Admitting: Emergency Medicine

## 2021-05-19 ENCOUNTER — Other Ambulatory Visit: Payer: Self-pay

## 2021-05-19 ENCOUNTER — Encounter (HOSPITAL_COMMUNITY): Payer: Self-pay | Admitting: Emergency Medicine

## 2021-05-19 ENCOUNTER — Ambulatory Visit (HOSPITAL_COMMUNITY)
Admission: EM | Admit: 2021-05-19 | Discharge: 2021-05-19 | Disposition: A | Discharge status: Other Acute Inpatient Hospital | Payer: Medicaid Other | Attending: Family | Admitting: Family

## 2021-05-19 DIAGNOSIS — Z79891 Long term (current) use of opiate analgesic: Secondary | ICD-10-CM | POA: Diagnosis not present

## 2021-05-19 DIAGNOSIS — Z9151 Personal history of suicidal behavior: Secondary | ICD-10-CM | POA: Insufficient documentation

## 2021-05-19 DIAGNOSIS — S99911A Unspecified injury of right ankle, initial encounter: Secondary | ICD-10-CM | POA: Insufficient documentation

## 2021-05-19 DIAGNOSIS — F332 Major depressive disorder, recurrent severe without psychotic features: Secondary | ICD-10-CM | POA: Insufficient documentation

## 2021-05-19 DIAGNOSIS — Z87891 Personal history of nicotine dependence: Secondary | ICD-10-CM | POA: Insufficient documentation

## 2021-05-19 DIAGNOSIS — W52XXXA Crushed, pushed or stepped on by crowd or human stampede, initial encounter: Secondary | ICD-10-CM | POA: Insufficient documentation

## 2021-05-19 DIAGNOSIS — M79671 Pain in right foot: Secondary | ICD-10-CM | POA: Insufficient documentation

## 2021-05-19 DIAGNOSIS — F1290 Cannabis use, unspecified, uncomplicated: Secondary | ICD-10-CM | POA: Insufficient documentation

## 2021-05-19 DIAGNOSIS — R45850 Homicidal ideations: Secondary | ICD-10-CM | POA: Insufficient documentation

## 2021-05-19 MED ORDER — MAGNESIUM HYDROXIDE 400 MG/5ML PO SUSP: 30.0000 mL | Freq: Every day | ORAL | Status: DC | PRN

## 2021-05-19 MED ORDER — HYDROXYZINE HCL 25 MG PO TABS: 25.0000 mg | ORAL_TABLET | Freq: Three times a day (TID) | ORAL | Status: DC | PRN

## 2021-05-19 MED ORDER — ACETAMINOPHEN 325 MG PO TABS: 650.0000 mg | ORAL_TABLET | Freq: Four times a day (QID) | ORAL | Status: DC | PRN

## 2021-05-19 MED ORDER — ALUM & MAG HYDROXIDE-SIMETH 200-200-20 MG/5ML PO SUSP: 30.0000 mL | ORAL | Status: DC | PRN

## 2021-05-19 MED ORDER — FLUOXETINE HCL 20 MG PO CAPS: 20.0000 mg | ORAL_CAPSULE | Freq: Every day | ORAL | Status: DC

## 2021-05-19 MED ORDER — TRAZODONE HCL 50 MG PO TABS: 50.0000 mg | ORAL_TABLET | Freq: Every evening | ORAL | Status: DC | PRN

## 2021-05-19 MED ORDER — IBUPROFEN 400 MG PO TABS
400.0000 mg | ORAL_TABLET | Freq: Once | ORAL | Status: AC
  Administered 2021-05-19: 400 mg via ORAL
  Filled 2021-05-19: qty 1
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
## 2021-05-19 NOTE — ED Notes (Signed)
Pt to be transferred back to [facility]. Pt informed of plan states she want to leave. Dr. Rhunette Croft informed.

## 2021-05-19 NOTE — Progress Notes (Signed)
Orthopedic Tech Progress Note Patient Details:  Vanessa Leonard [birth year] [id number]  Ortho Devices Type of Ortho Device: CAM walker Ortho Device/Splint Location: RLE Ortho Device/Splint Interventions: Ordered, Application   Post Interventions Patient Tolerated: Well Instructions Provided: Adjustment of device  [name] 05/19/2021, 5:20 PM
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
## 2021-05-19 NOTE — ED Notes (Addendum)
Upon returning to beside. Pt eloped. Dr. Rhunette Croft aware

## 2021-05-19 NOTE — ED Notes (Signed)
Called Ortho for CAM boot, left voicemail for Ortho team.
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
## 2021-05-23 ENCOUNTER — Ambulatory Visit (INDEPENDENT_AMBULATORY_CARE_PROVIDER_SITE_OTHER): Payer: Medicaid Other | Admitting: Psychiatry

## 2021-05-23 ENCOUNTER — Other Ambulatory Visit: Payer: Self-pay

## 2021-05-23 ENCOUNTER — Encounter (HOSPITAL_COMMUNITY): Payer: Self-pay | Admitting: Psychiatry

## 2021-05-23 VITALS — BP 114/66 | HR 56 | Ht 68.0 in | Wt 267.0 lb

## 2021-05-23 DIAGNOSIS — F411 Generalized anxiety disorder: Secondary | ICD-10-CM | POA: Insufficient documentation

## 2021-05-23 DIAGNOSIS — F1290 Cannabis use, unspecified, uncomplicated: Secondary | ICD-10-CM | POA: Insufficient documentation

## 2021-05-23 DIAGNOSIS — F331 Major depressive disorder, recurrent, moderate: Secondary | ICD-10-CM | POA: Insufficient documentation

## 2021-05-23 MED ORDER — HYDROXYZINE HCL 10 MG PO TABS: 10.0000 mg | ORAL_TABLET | Freq: Three times a day (TID) | ORAL | 2 refills | Status: DC | PRN

## 2021-05-23 MED ORDER — BUPROPION HCL ER (XL) 150 MG PO TB24: 150.0000 mg | ORAL_TABLET | ORAL | 2 refills | Status: DC

## 2021-05-23 MED ORDER — TRAZODONE HCL 50 MG PO TABS: 50.0000 mg | ORAL_TABLET | Freq: Every evening | ORAL | 2 refills | Status: DC | PRN
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
## 2021-05-24 ENCOUNTER — Ambulatory Visit (INDEPENDENT_AMBULATORY_CARE_PROVIDER_SITE_OTHER): Payer: Medicaid Other | Admitting: Clinical

## 2021-05-24 DIAGNOSIS — F331 Major depressive disorder, recurrent, moderate: Secondary | ICD-10-CM | POA: Diagnosis not present
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
## 2021-07-24 ENCOUNTER — Other Ambulatory Visit: Payer: Self-pay

## 2021-07-24 ENCOUNTER — Ambulatory Visit (INDEPENDENT_AMBULATORY_CARE_PROVIDER_SITE_OTHER): Payer: Medicaid Other | Admitting: Clinical

## 2021-07-24 DIAGNOSIS — F411 Generalized anxiety disorder: Secondary | ICD-10-CM | POA: Diagnosis not present

## 2021-07-24 NOTE — Progress Notes (Signed)
   THERAPIST PROGRESS NOTE  Session Time: 25 minutes  Participation Level: Active  Behavioral Response: CasualAlertEuthymic  Type of Therapy: Individual Therapy  Treatment Goals addressed: Coping  Interventions: CBT and Supportive  Summary:  Vanessa Leonard is a 30 y.o. female who presents for the scheduled session oriented times five, appropriately dressed, and friendly. Client denied hallucinations and delusions. Client reported on today she has been managing fairly well. Client reported she has been medication complaint and that is going well. Client reported she has been working two jobs at [employer] and [employer]. Client reported she works Monday through Sunday doing both jobs. Client reported she believes her depression has improved but her anxiety has increased. Client reported she has experienced increased irritability. Client reported she has noticed lingering irritability from situations that may occur. Client reported she masks how she feels until she gets home.  Client engaged in discussion with the therapist that her worklife balance may be affecting her emotional distress at times.  Client reported she is still working on how to prioritize and make a balanced routine for herself.  Client reported she has good support from her parents and grandmother and friends.     Suicidal/Homicidal: Nowithout intent/plan  Therapist Response:  Therapist began the session asking the client how she has been doing since last seen. Therapist used CBT to utilize active listening and positive emotional support towards her thoughts and feelings. Therapist used CBT to engage with the client to ask open-ended questions about her daily routine and her brainstormed ideas of how it impacts her mental health symptoms. Therapist used CBT to discuss how worklife balance affects symptoms of burnout. Therapist assigned the client homework to contemplate the pros and cons of her current work schedule and how she  could create balance for herself. Client was scheduled for next appointment.    Plan: Return again in 5 weeks.  Diagnosis: Generalized anxiety disorder   Neena Rhymes [place], LCSW 07/24/2021

## 2021-08-07 ENCOUNTER — Other Ambulatory Visit: Payer: Self-pay

## 2021-08-07 ENCOUNTER — Ambulatory Visit (INDEPENDENT_AMBULATORY_CARE_PROVIDER_SITE_OTHER): Payer: Medicaid Other | Admitting: Clinical

## 2021-08-07 DIAGNOSIS — F411 Generalized anxiety disorder: Secondary | ICD-10-CM | POA: Diagnosis not present

## 2021-08-07 NOTE — Progress Notes (Signed)
   THERAPIST PROGRESS NOTE  Session Time: 25 minutes  Participation Level: Active  Behavioral Response: CasualAlertEuthymic  Type of Therapy: Individual Therapy  Treatment Goals addressed: Coping  Interventions: CBT and Supportive  Summary:  Vanessa Leonard is a 30 y.o. female who presents for the scheduled session oriented x5, appropriately dressed, and friendly.  Client denied hallucinations and delusions. Client reported on today she is doing well.  Client reported over the past week she celebrated her daughter's birthday with her family.  Client reported otherwise work has been going well for her.  Client reported she has noticed that lately she has been getting tired and falling asleep without noticing which is unusual for her.  Client reported she usually stays up having thoughts that prevent her from falling asleep.  Client reported she thinks that may be due to planning her daughter's birthday and being tired from work.  Client reported otherwise she has been working on organizing things in a more efficient way for herself.  Client reported overall although she does complete things of depression she does not want to continue to get things done that way because it causes her to be irritable.  Client reported that she also has been noticing that she needs to make a point to speak up for herself and times that may be under pressure especially at work dealing with customers.   Suicidal/Homicidal: Nowithout intent/plan  Therapist Response:  Therapist began the appointment asking the client how she has been doing since last seen. Therapist used CBT to utilize active listening and positive emotional support towards her thoughts and feelings. Therapist used CBT to engage the client to ask her about challenges that she has identified in her daily life which need improvement to help with her mental health symptoms of depression and anxiety. Therapist used CBT to engage with the client to  discuss organization and prioritization to help alleviate feelings of being overwhelmed and irritability. Therapist assigned to client homework to practice using her assertiveness appropriately and organize weekly her task for the week. Client was scheduled for next appointment.     Plan: Return again in 5 weeks.  Diagnosis: Generalized anxiety disorder  Neena Rhymes Azaliyah Kennard, LCSW 08/07/2021

## 2021-08-21 ENCOUNTER — Ambulatory Visit (HOSPITAL_COMMUNITY): Payer: Self-pay | Admitting: Clinical

## 2021-08-23 ENCOUNTER — Encounter (HOSPITAL_COMMUNITY): Payer: Self-pay | Admitting: Psychiatry

## 2021-08-23 ENCOUNTER — Other Ambulatory Visit: Payer: Self-pay

## 2021-08-23 ENCOUNTER — Other Ambulatory Visit (HOSPITAL_COMMUNITY): Payer: Self-pay

## 2021-08-23 ENCOUNTER — Ambulatory Visit (INDEPENDENT_AMBULATORY_CARE_PROVIDER_SITE_OTHER): Payer: Medicaid Other | Admitting: Psychiatry

## 2021-08-23 DIAGNOSIS — F411 Generalized anxiety disorder: Secondary | ICD-10-CM | POA: Diagnosis not present

## 2021-08-23 DIAGNOSIS — F331 Major depressive disorder, recurrent, moderate: Secondary | ICD-10-CM | POA: Diagnosis not present

## 2021-08-23 MED ORDER — HYDROXYZINE HCL 10 MG PO TABS
10.0000 mg | ORAL_TABLET | Freq: Three times a day (TID) | ORAL | 3 refills | Status: AC | PRN
  Filled 2021-08-23: qty 90, 30d supply, fill #0

## 2021-08-23 MED ORDER — TRAZODONE HCL 50 MG PO TABS
50.0000 mg | ORAL_TABLET | Freq: Every evening | ORAL | 3 refills | Status: AC | PRN
  Filled 2021-08-23: qty 30, 30d supply, fill #0

## 2021-08-23 MED ORDER — BUPROPION HCL ER (XL) 150 MG PO TB24
150.0000 mg | ORAL_TABLET | ORAL | 3 refills | Status: AC
  Filled 2021-08-23: qty 30, 30d supply, fill #0

## 2021-08-23 NOTE — Progress Notes (Signed)
BH MD/PA/NP OP Progress Note  08/23/2021 1:51 PM Vanessa Leonard  MRN:  [id number]  Chief Complaint: "Things are better" Chief Complaint   Medication Management     HPI:  30 year old female seen today for follow up psychiatric evaluation.  She has a psychiatric history of ADHD, depression, anxiety, SI/SA.  Currently she is managed Wellbutrin XL 150 mg daily, trazodone 25 to 50 mg nightly as needed, and hydroxyzine 10 mg 3 times daily as needed.  She notes her medications are effective for managing her psychiatric conditions.  Today she is well-groomed, pleasant, cooperative, engaged in conversation, and maintains eye contact.  She informed Clinical research associate that since her last visit things have been better.  She notes that she continues to live with her grandparents and recently started two new jobs.  She notes that she works at [employer] and [employer] (on [employer] ).  Patient notes that since her last visit her anxiety and her depression has improved.  Provider conducted a GAD-7 and patient scored a 7, at her last visit she scored a 19.  Notes at times she request about managing her money however notes that her finances are in order.  Provider also conducted PHQ-9 and patient scored a 9, at her last visit she scored a 19.  She notes that her appetite and weight fluctuates.  She notes that she gains and loses 5 pounds periodically.  Patient notes her sleep has improved and reports that she sleeps approximately 6 hours nightly.  She notes at times she is tired because of her work schedules but reports that she is able to cope with this. Today she denies SI/HI/VAH, mania, or paranoia.   Patient informed Clinical research associate that her daughter is doing well.  She informed Clinical research associate that she recently turned 5 and they celebrated her birthday at [employer].   Patient informed Clinical research associate that she smokes marijuana periodically however notes that she no longer smokes a substance every day.  Provider informed patient  that marijuana can exacerbate her mental health conditions.  She endorsed understanding and agreed.  No medication changes made today.  Patient agreeable to continue medications as prescribed.  She will follow-up with outpatient counseling for therapy.  No other concerns at this time  Visit Diagnosis:    ICD-10-CM   1. Moderate episode of recurrent major depressive disorder (HCC)  F33.1 buPROPion (WELLBUTRIN XL) 150 MG 24 hr tablet    traZODone (DESYREL) 50 MG tablet    2. Generalized anxiety disorder  F41.1 hydrOXYzine (ATARAX/VISTARIL) 10 MG tablet    traZODone (DESYREL) 50 MG tablet      Past Psychiatric History: ADHD, depression, anxiety, SI/SA  Past Medical History:  Past Medical History:  Diagnosis Date   Muscle ache     Past Surgical History:  Procedure Laterality Date   MOUTH SURGERY      Family Psychiatric History:  Maternal aunt Bipolar disorder, maternal grandmother anxiety and depression, mother anxiety, maternal cousin Bipolar disorder   Family History: History reviewed. No pertinent family history.  Social History:  Social History   Socioeconomic History   Marital status: Single    Spouse name: Not on file   Number of children: Not on file   Years of education: Not on file   Highest education level: Not on file  Occupational History   Not on file  Tobacco Use   Smoking status: Former   Smokeless tobacco: Never  Substance and Sexual Activity   Alcohol use: No  Drug use: Yes    Types: Marijuana   Sexual activity: Yes    Birth control/protection: Condom, Injection  Other Topics Concern   Not on file  Social History Narrative   Not on file   Social Determinants of Health   Financial Resource Strain: Not on file  Food Insecurity: Not on file  Transportation Needs: Not on file  Physical Activity: Not on file  Stress: Not on file  Social Connections: Not on file    Allergies: No Known Allergies  Metabolic Disorder Labs: Lab Results   Component Value Date   HGBA1C 5.1 10/09/2013   No results found for: PROLACTIN Lab Results  Component Value Date   CHOL 216 (H) 12/02/2009   TRIG 67 12/02/2009   HDL 41 12/02/2009   CHOLHDL 5.3 Ratio 12/02/2009   VLDL 13 12/02/2009   LDLCALC 162 (H) 12/02/2009   No results found for: TSH  Therapeutic Level Labs: No results found for: LITHIUM No results found for: VALPROATE No components found for:  CBMZ  Current Medications: Current Outpatient Medications  Medication Sig Dispense Refill   buPROPion (WELLBUTRIN XL) 150 MG 24 hr tablet Take 1 tablet (150 mg total) by mouth every morning. 30 tablet 3   cyclobenzaprine (FLEXERIL) 10 MG tablet Take 1 tablet (10 mg total) by mouth 2 (two) times daily as needed for muscle spasms. 30 tablet 0   doxycycline (VIBRA-TABS) 100 MG tablet Take 1 tablet (100 mg total) by mouth 2 (two) times daily. 20 tablet 0   hydrOXYzine (ATARAX/VISTARIL) 10 MG tablet Take 1 tablet (10 mg total) by mouth 3 (three) times daily as needed. 90 tablet 3   ibuprofen (ADVIL) 600 MG tablet Take 1 tablet (600 mg total) by mouth every 6 (six) hours as needed. 60 tablet 0   medroxyPROGESTERone (DEPO-PROVERA) 150 MG/ML injection Inject 150 mg into the muscle every 3 (three) months.     metroNIDAZOLE (FLAGYL) 500 MG tablet Take 1 tablet (500 mg total) by mouth 2 (two) times daily. 14 tablet 0   traZODone (DESYREL) 50 MG tablet Take 1 tablet (50 mg total) by mouth at bedtime as needed for sleep. 30 tablet 3   No current facility-administered medications for this visit.     Musculoskeletal: Strength & Muscle Tone: within normal limits Gait & Station: normal Patient leans: N/A  Psychiatric Specialty Exam: Review of Systems  Blood pressure (!) 144/72, pulse (!) 57, height 5\' 8"  (1.727 m), weight 283 lb (128.4 kg), SpO2 100 %.Body mass index is 43.03 kg/m.  General Appearance: Well Groomed  Eye Contact:  Good  Speech:  Clear and Coherent and Normal Rate  Volume:   Normal  Mood:  Euthymic  Affect:  Appropriate and Congruent  Thought Process:  Coherent, Goal Directed, and Linear  Orientation:  Full (Time, Place, and Person)  Thought Content: WDL and Logical   Suicidal Thoughts:  No  Homicidal Thoughts:  No  Memory:  Immediate;   Good Recent;   Good Remote;   Good  Judgement:  Good  Insight:  Good  Psychomotor Activity:  Normal  Concentration:  Concentration: Good and Attention Span: Good  Recall:  Good  Fund of Knowledge: Good  Language: Good  Akathisia:  No  Handed:  Right  AIMS (if indicated): not done  Assets:  Communication Skills Desire for Improvement Financial Resources/Insurance Housing Physical Health Social Support  ADL's:  Intact  Cognition: WNL  Sleep:  Good   Screenings: GAD-7    Flowsheet Row  Clinical Support from 08/23/2021 in [facility] Counselor from 05/24/2021 in [facility] Office Visit from 05/23/2021 in [facility]  Total GAD-7 Score 7 19 19       PHQ2-9    Flowsheet Row Clinical Support from 08/23/2021 in [facility] Counselor from 05/24/2021 in [facility] Office Visit from 05/23/2021 in [facility] Office Visit from 02/16/2015 in [facility] Office Visit from 12/08/2013 in [facility]  PHQ-2 Total Score 3 3 3  0 0  PHQ-9 Total Score 9 17 19  -- --      Flowsheet Row Counselor from 05/24/2021 in [facility] Office Visit from 05/23/2021 in [facility] ED from 04/30/2021 in [facility] RISK CATEGORY No Risk Error: Q7 should not be populated when Q6 is No Error: Question 6 not populated        Assessment and Plan: Patient notes that she is doing well on her current medication regimen.  No medication changes made  today.  Patient agreeable to continue medication as prescribed.  1. Moderate episode of recurrent major depressive disorder (HCC)  Continue- buPROPion (WELLBUTRIN XL) 150 MG 24 hr tablet; Take 1 tablet (150 mg total) by mouth every morning.  Dispense: 30 tablet; Refill: 3 Continue- traZODone (DESYREL) 50 MG tablet; Take 1 tablet (50 mg total) by mouth at bedtime as needed for sleep.  Dispense: 30 tablet; Refill: 3  2. Generalized anxiety disorder  Continue- hydrOXYzine (ATARAX/VISTARIL) 10 MG tablet; Take 1 tablet (10 mg total) by mouth 3 (three) times daily as needed.  Dispense: 90 tablet; Refill: 3 Continue- traZODone (DESYREL) 50 MG tablet; Take 1 tablet (50 mg total) by mouth at bedtime as needed for sleep.  Dispense: 30 tablet; Refill: 3  Follow-up in 3 months Follow-up with therapy  06/30/2021, NP 08/23/2021, 1:51 PM

## 2021-08-31 ENCOUNTER — Other Ambulatory Visit (HOSPITAL_COMMUNITY): Payer: Self-pay

## 2021-09-02 ENCOUNTER — Ambulatory Visit (HOSPITAL_COMMUNITY)
Admission: EM | Admit: 2021-09-02 | Discharge: 2021-09-02 | Disposition: A | Discharge status: Home / Self Care | Payer: Medicaid Other | Attending: Family Medicine | Admitting: Family Medicine

## 2021-09-02 ENCOUNTER — Encounter (HOSPITAL_COMMUNITY): Payer: Self-pay

## 2021-09-02 ENCOUNTER — Other Ambulatory Visit: Payer: Self-pay

## 2021-09-02 DIAGNOSIS — J3089 Other allergic rhinitis: Secondary | ICD-10-CM | POA: Diagnosis not present

## 2021-09-02 DIAGNOSIS — H66001 Acute suppurative otitis media without spontaneous rupture of ear drum, right ear: Secondary | ICD-10-CM

## 2021-09-02 MED ORDER — AMOXICILLIN-POT CLAVULANATE 875-125 MG PO TABS: 1.0000 | ORAL_TABLET | Freq: Two times a day (BID) | ORAL | 0 refills | Status: AC

## 2021-09-02 MED ORDER — KETOROLAC TROMETHAMINE 60 MG/2ML IM SOLN
60.0000 mg | Freq: Once | INTRAMUSCULAR | Status: AC
  Administered 2021-09-02: 60 mg via INTRAMUSCULAR

## 2021-09-02 MED ORDER — KETOROLAC TROMETHAMINE 60 MG/2ML IM SOLN
INTRAMUSCULAR | Status: AC
  Filled 2021-09-02: qty 2

## 2021-09-02 MED ORDER — FLUTICASONE PROPIONATE 50 MCG/ACT NA SUSP: 1.0000 | Freq: Two times a day (BID) | NASAL | 2 refills | Status: AC

## 2021-09-02 NOTE — ED Triage Notes (Signed)
Pt reports right ear pain x 1 day, sinus pressure x 2 weeks.

## 2021-09-02 NOTE — ED Provider Notes (Addendum)
[facility]    CSN: [id number] Arrival date & time: 09/02/21  1318      History   Chief Complaint Chief Complaint  Patient presents with   Otalgia   sinus pressure    HPI Vanessa Leonard is a 30 y.o. female.   Patient presenting today with 2-week history of sinus pressure, congestion and now 1 day of progressively worsening severe sharp right ear pain, muffled hearing.  Denies drainage from the ear, fever, cough, sore throat, chest pain, shortness of breath.  Took some DayQuil but otherwise has not tried anything over-the-counter for symptoms.  No new sick contacts.   Past Medical History:  Diagnosis Date   Muscle ache     Patient Active Problem List   Diagnosis Date Noted   Generalized anxiety disorder 05/23/2021   Moderate episode of recurrent major depressive disorder (HCC) 05/23/2021   Marijuana use 05/23/2021   Seasonal allergies 07/26/2014   Depo-Provera contraceptive status 07/26/2014   Depression with anxiety 11/25/2013   Other general counseling and advice for contraceptive management 07/15/2013   Spasm of back muscles 04/15/2013   Elevated BP 04/15/2013   Well child visit 07/03/2012   Heavy menstrual bleeding 06/27/2012   OBESITY, MORBID 12/02/2009   ADD 12/02/2009   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      Home Medications    Prior to Admission medications   Medication Sig Start Date End Date Taking? Authorizing Provider  amoxicillin-clavulanate (AUGMENTIN) 875-125 MG tablet Take 1 tablet by mouth every 12 (twelve) hours. 09/02/21  Yes Particia Nearing, PA-C  fluticasone [facility]) 50 MCG/ACT nasal spray Place 1 spray into both nostrils 2 (two) times daily. 09/02/21  Yes Particia Nearing, PA-C  buPROPion (WELLBUTRIN XL) 150 MG 24 hr tablet Take 1 tablet (150 mg total) by mouth every morning. 08/23/21   Shanna Cisco, NP  cyclobenzaprine (FLEXERIL) 10 MG tablet Take 1 tablet (10 mg total) by mouth 2 (two) times daily as needed for muscle spasms. 12/15/19   Dahlia Byes A, NP  doxycycline (VIBRA-TABS) 100 MG tablet Take 1 tablet (100 mg total) by mouth 2 (two) times daily. 04/30/21   Particia Nearing, PA-C  hydrOXYzine (ATARAX/VISTARIL) 10 MG tablet Take 1 tablet (10 mg total) by mouth 3 (three) times daily as needed. 08/23/21   Shanna Cisco, NP  ibuprofen (ADVIL) 600 MG tablet Take 1 tablet (600 mg total) by mouth every 6 (six) hours as needed. 12/15/19   Dahlia Byes A, NP  medroxyPROGESTERone (DEPO-PROVERA) 150 MG/ML injection Inject 150 mg into the muscle every 3 (three) months.    [provider]  metroNIDAZOLE (FLAGYL) 500 MG tablet Take 1 tablet (500 mg total) by mouth 2 (two) times daily. 09/27/20   Merrilee Jansky, MD  traZODone (DESYREL) 50 MG tablet Take 1 tablet (50 mg total) by mouth at bedtime as needed for sleep. 08/23/21   Shanna Cisco, NP  cetirizine (ZYRTEC) 10 MG tablet Take 1 tablet (10 mg total) by mouth daily. 02/16/15 12/15/19  Erasmo Downer, MD    Family History History reviewed. No pertinent family history.  Social History Social History   Tobacco Use   Smoking status: Former   Smokeless tobacco: Never  Substance Use Topics   Alcohol use: No   Drug use: Yes  Types: Marijuana     Allergies   Patient has no known allergies.   Review of Systems Review of Systems Per HPI  Physical Exam Triage Vital Signs ED Triage Vitals  Enc Vitals Group     BP 09/02/21 1441 (!) 156/114     Pulse Rate 09/02/21 1441 60     Resp 09/02/21 1441 18     Temp 09/02/21 1441 98.1 F (36.7 C)     Temp Source 09/02/21 1441 Oral     SpO2 09/02/21 1441 98 %     Weight --      Height --      Head Circumference --      Peak Flow --      Pain Score 09/02/21 1438 10     Pain Loc --      Pain Edu? --      Excl. in GC? --    No data found.  Updated  Vital Signs BP (!) 156/114 (BP Location: Right Wrist)   Pulse 60   Temp 98.1 F (36.7 C) (Oral)   Resp 18   SpO2 98%   Visual Acuity Right Eye Distance:   Left Eye Distance:   Bilateral Distance:    Right Eye Near:   Left Eye Near:    Bilateral Near:     Physical Exam Vitals and nursing note reviewed.  Constitutional:      Appearance: Normal appearance. She is not ill-appearing.  HENT:     Head: Atraumatic.     Left Ear: Tympanic membrane normal.     Ears:     Comments: Right TM erythematous, edematous    Nose: Rhinorrhea present.     Mouth/Throat:     Mouth: Mucous membranes are moist.     Pharynx: Posterior oropharyngeal erythema present.  Eyes:     Extraocular Movements: Extraocular movements intact.     Conjunctiva/sclera: Conjunctivae normal.  Cardiovascular:     Rate and Rhythm: Normal rate and regular rhythm.     Heart sounds: Normal heart sounds.  Pulmonary:     Effort: Pulmonary effort is normal.     Breath sounds: Normal breath sounds.  Abdominal:     General: Bowel sounds are normal. There is no distension.     Palpations: Abdomen is soft.     Tenderness: There is no abdominal tenderness. There is no guarding.  Musculoskeletal:        General: Normal range of motion.     Cervical back: Normal range of motion and neck supple.  Skin:    General: Skin is warm and dry.  Neurological:     Mental Status: She is alert and oriented to person, place, and time.     Motor: No weakness.     Gait: Gait normal.  Psychiatric:        Mood and Affect: Mood normal.        Thought Content: Thought content normal.        Judgment: Judgment normal.   UC Treatments / Results  Labs (all labs ordered are listed, but only abnormal results are displayed) Labs Reviewed - No data to display  EKG   Radiology No results found.  Procedures Procedures (including critical care time)  Medications Ordered in UC Medications  ketorolac (TORADOL) injection 60 mg (has no  administration in time range)    Initial Impression / Assessment and Plan / UC Course  I have reviewed the triage vital signs and the nursing notes.  Pertinent  labs & imaging results that were available during my care of the patient were reviewed by me and considered in my medical decision making (see chart for details).     We will treat with Augmentin, Flonase and discussed starting antihistamine daily.  IM Toradol given today for pain prior to discharge.  Follow-up with worsening symptoms.  Work note given  Final Clinical Impressions(s) / UC Diagnoses   Final diagnoses:  Acute suppurative otitis media of right ear without spontaneous rupture of tympanic membrane, recurrence not specified  Seasonal allergic rhinitis due to other allergic trigger   Discharge Instructions   None    ED Prescriptions     Medication Sig Dispense Auth. Provider   amoxicillin-clavulanate (AUGMENTIN) 875-125 MG tablet Take 1 tablet by mouth every 12 (twelve) hours. 14 tablet Particia Nearing, PA-C   fluticasone [facility]) 50 MCG/ACT nasal spray Place 1 spray into both nostrils 2 (two) times daily. 16 g Particia Nearing, [place]      PDMP not reviewed this encounter.   Particia Nearing, [place] 09/02/21 1526    Roosvelt [employer], [place] 09/02/21 1528

## 2021-10-02 ENCOUNTER — Ambulatory Visit (HOSPITAL_COMMUNITY): Payer: Medicaid Other | Admitting: Clinical

## 2021-11-23 ENCOUNTER — Encounter (HOSPITAL_COMMUNITY): Payer: Self-pay

## 2021-11-23 ENCOUNTER — Telehealth (HOSPITAL_COMMUNITY): Payer: Medicaid Other | Admitting: Psychiatry

## 2022-07-31 IMAGING — CT CT ANKLE*R* W/O CM
3 series · 14 of 34 positions shown, 17 images · non-contrast
Comparison: None.

CLINICAL DATA: Right ankle and foot pain.

EXAM:
CT OF THE RIGHT ANKLE WITHOUT CONTRAST
TECHNIQUE: Multidetector CT imaging of the right ankle was performed according
to the standard protocol. Multiplanar CT image reconstructions were
also generated.

[Series 5: cor soft tissue · coronal · 0.23mm/px · 3 of 96 slices shown]
[im 20/96  bone]
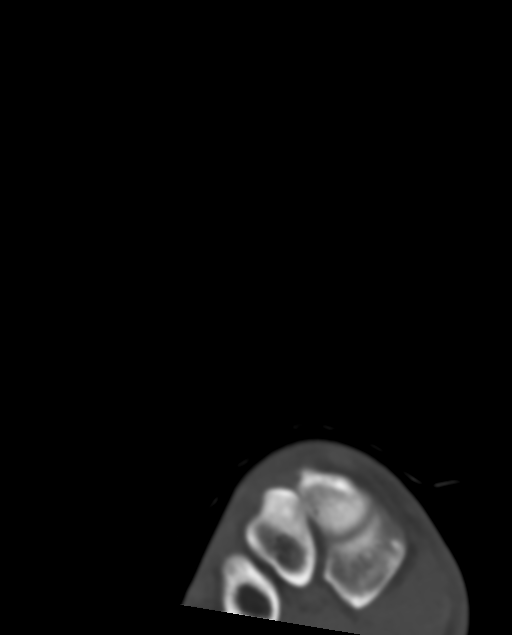
[im 39/96  bone]
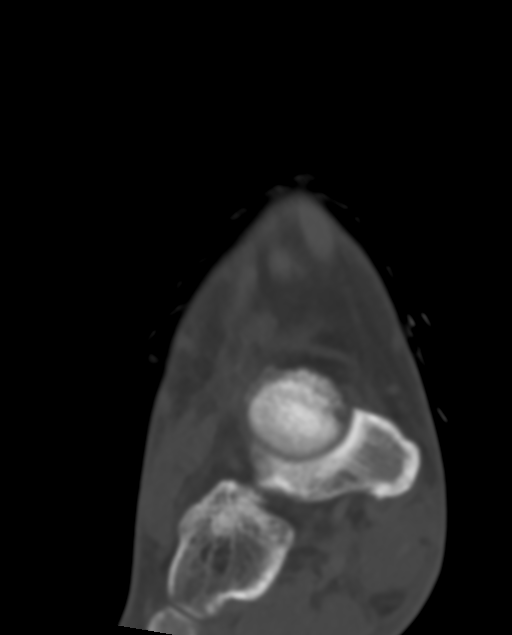
[im 58/96  bone]
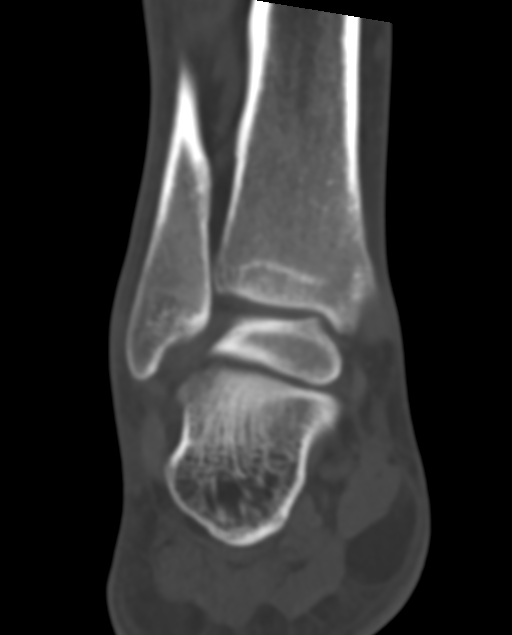

[Series 6: sag soft tissue · sagittal · 0.28mm/px · 5 of 59 slices shown, 6 images]
[im 20/59  bone]
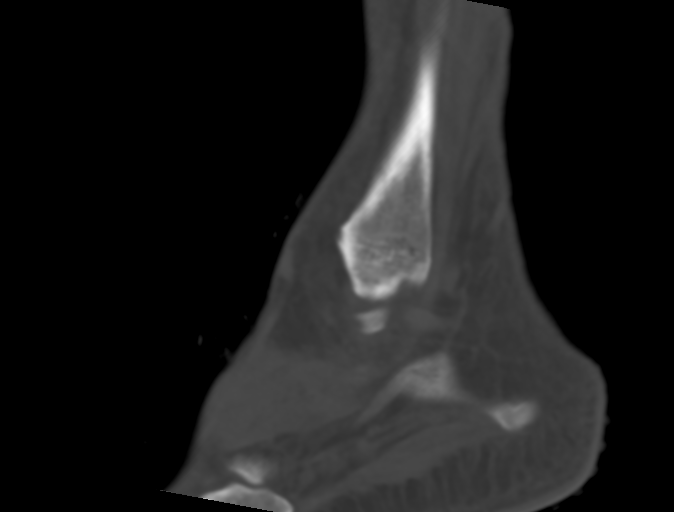
[im 25/59  bone]
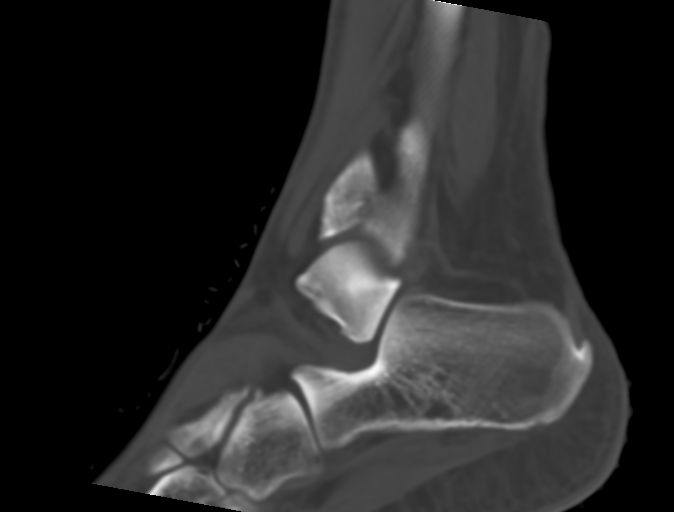
[im 30/59  soft-tissue]
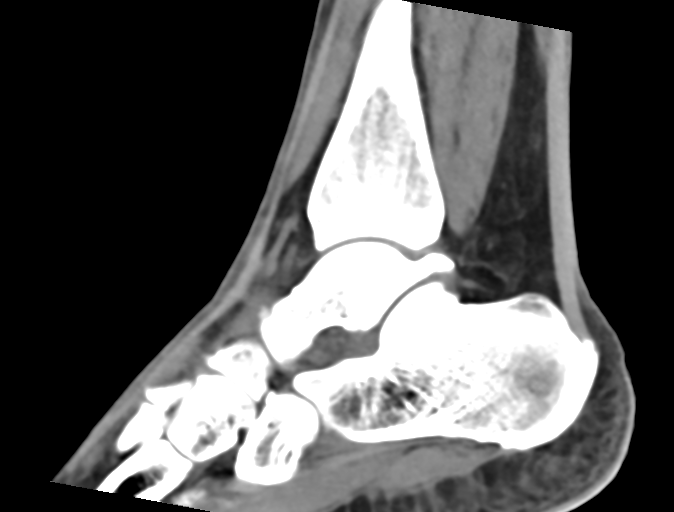
[im 30/59  bone]
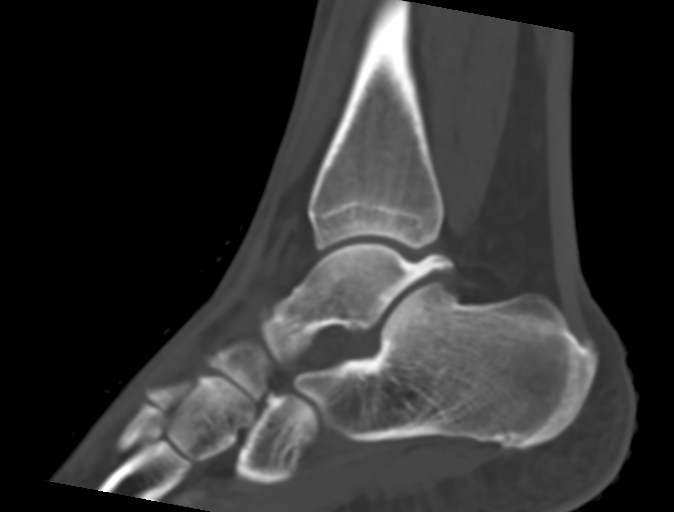
[im 34/59  bone]
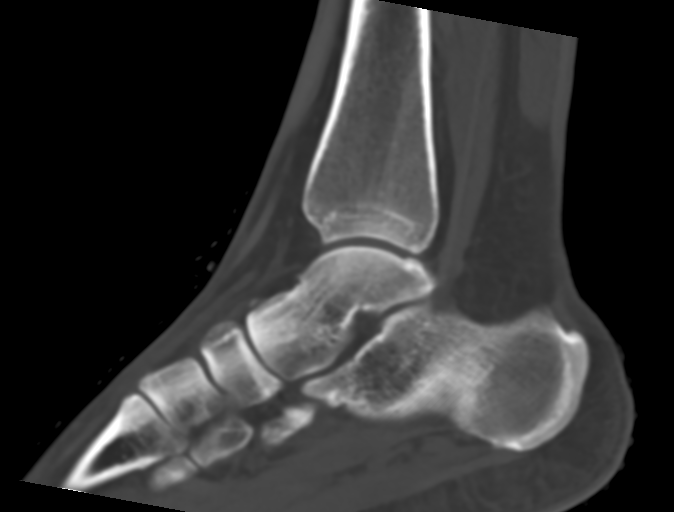
[im 39/59  bone]
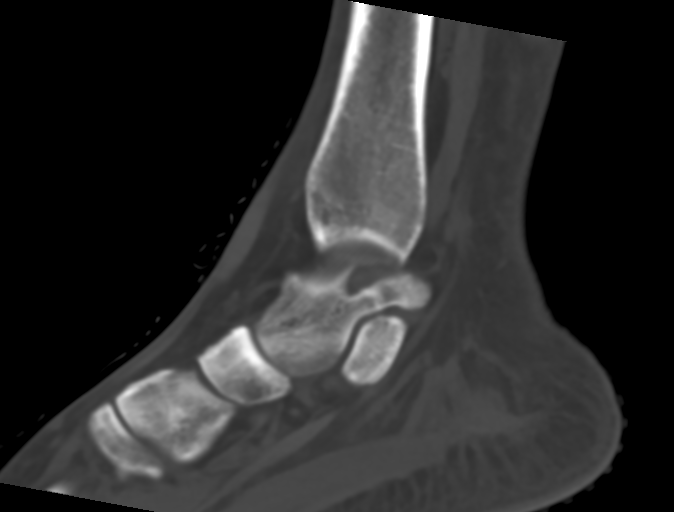

[Series 8: extremity soft tissue · axial · 0.40mm/px · z∈[-86,+28]mm · 6 of 75 slices shown, 8 images]
[im 12/75  soft-tissue]
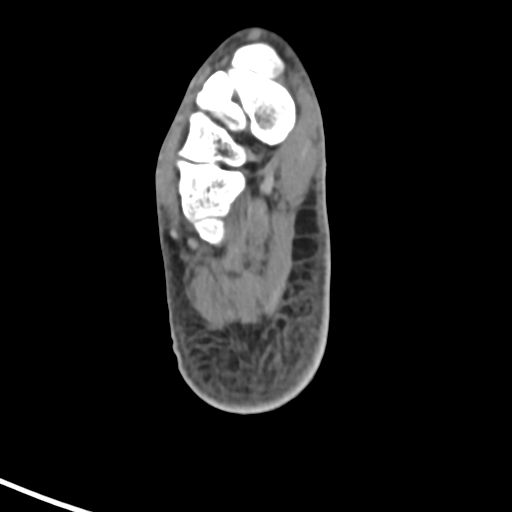
[im 12/75  bone]
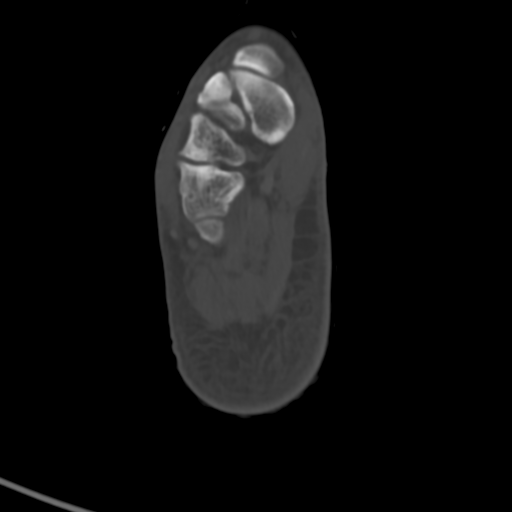
[im 23/75  bone]
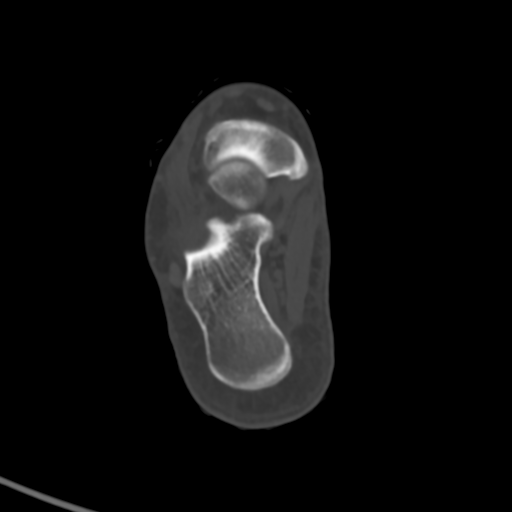
[im 35/75  bone]
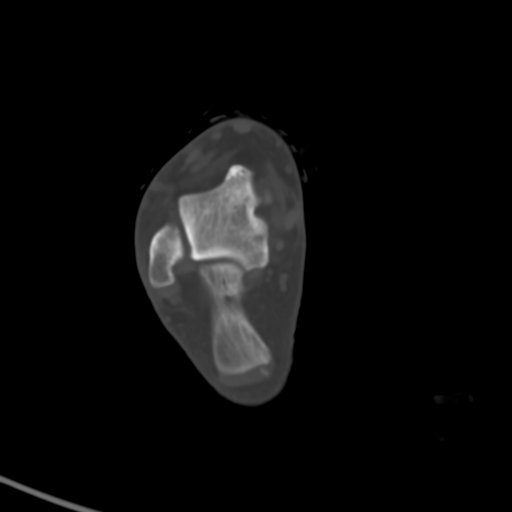
[im 46/75  bone]
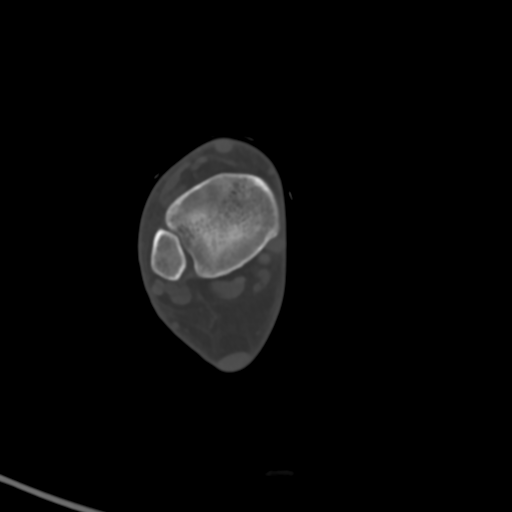
[im 57/75  soft-tissue]
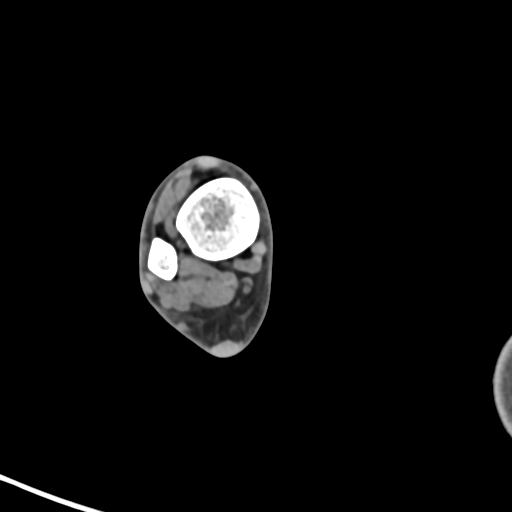
[im 57/75  bone]
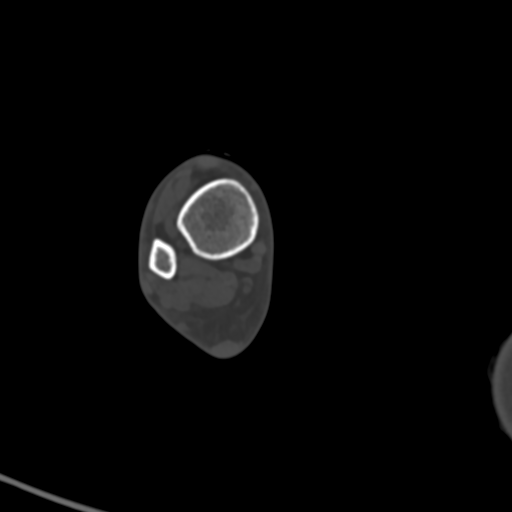
[im 69/75  bone]
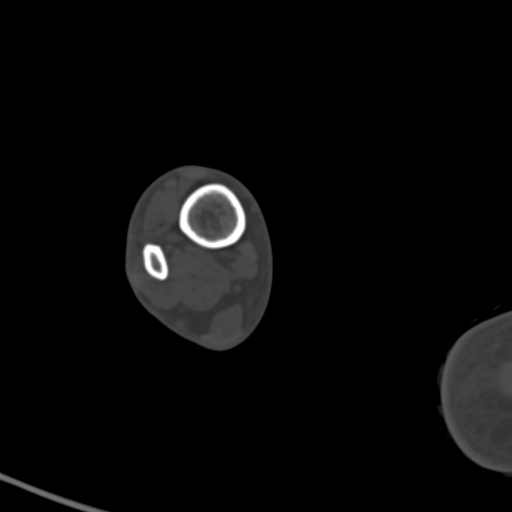

[14 of 34 positions shown; findings below may reference images not displayed]

FINDINGS: Bones/Joint/Cartilage

Small avulsion fracture along the distal dorsal aspect of the talus
at the talonavicular joint.

No other fracture or dislocation. Normal alignment. No joint
effusion.

Ligaments

Ligaments are suboptimally evaluated by CT.

Muscles and Tendons
Muscles are normal. Flexor, extensor, peroneal and Achilles tendons
are intact. Plantar fascia is intact.

Soft tissue
No fluid collection or hematoma.  No soft tissue mass.
IMPRESSION: 1. Small avulsion fracture along the distal dorsal aspect of the
talus at the talonavicular joint.
